# Patient Record
Sex: Female | Born: 1975 | Race: Black or African American | Hispanic: No | Marital: Single | Smoking: Never smoker
Health system: Southern US, Community
[De-identification: ages and names within clinical notes are randomized; demographics above are authoritative.]

## PROBLEM LIST (undated history)

## (undated) DIAGNOSIS — R011 Cardiac murmur, unspecified: Secondary | ICD-10-CM

## (undated) DIAGNOSIS — I10 Essential (primary) hypertension: Secondary | ICD-10-CM

## (undated) DIAGNOSIS — E079 Disorder of thyroid, unspecified: Secondary | ICD-10-CM

## (undated) DIAGNOSIS — E039 Hypothyroidism, unspecified: Secondary | ICD-10-CM

## (undated) DIAGNOSIS — F419 Anxiety disorder, unspecified: Secondary | ICD-10-CM

## (undated) DIAGNOSIS — K219 Gastro-esophageal reflux disease without esophagitis: Secondary | ICD-10-CM

## (undated) HISTORY — PX: TUBAL LIGATION: SHX77

---

## 1998-02-12 ENCOUNTER — Inpatient Hospital Stay (HOSPITAL_COMMUNITY): Admission: AD | Admit: 1998-02-12 | Discharge: 1998-02-14 | Payer: Self-pay | Admitting: Gynecology

## 1998-02-12 ENCOUNTER — Inpatient Hospital Stay (HOSPITAL_COMMUNITY): Admission: AD | Admit: 1998-02-12 | Discharge: 1998-02-12 | Payer: Self-pay | Admitting: Obstetrics and Gynecology

## 1998-11-03 ENCOUNTER — Inpatient Hospital Stay (HOSPITAL_COMMUNITY): Admission: AD | Admit: 1998-11-03 | Discharge: 1998-11-03 | Payer: Self-pay | Admitting: Ophthalmology

## 1999-05-15 DIAGNOSIS — A159 Respiratory tuberculosis unspecified: Secondary | ICD-10-CM

## 1999-05-15 HISTORY — DX: Respiratory tuberculosis unspecified: A15.9

## 2000-09-05 ENCOUNTER — Other Ambulatory Visit: Admission: RE | Admit: 2000-09-05 | Discharge: 2000-09-05 | Payer: Self-pay | Admitting: Gynecology

## 2001-09-11 ENCOUNTER — Other Ambulatory Visit: Admission: RE | Admit: 2001-09-11 | Discharge: 2001-09-11 | Payer: Self-pay | Admitting: Gynecology

## 2002-01-22 ENCOUNTER — Ambulatory Visit (HOSPITAL_COMMUNITY): Admission: RE | Admit: 2002-01-22 | Discharge: 2002-01-22 | Payer: Self-pay | Admitting: Gynecology

## 2003-01-22 ENCOUNTER — Other Ambulatory Visit: Admission: RE | Admit: 2003-01-22 | Discharge: 2003-01-22 | Payer: Self-pay | Admitting: Internal Medicine

## 2003-02-10 ENCOUNTER — Encounter: Admission: RE | Admit: 2003-02-10 | Discharge: 2003-02-10 | Payer: Self-pay | Admitting: Internal Medicine

## 2003-02-10 ENCOUNTER — Encounter: Payer: Self-pay | Admitting: Internal Medicine

## 2003-07-17 ENCOUNTER — Emergency Department (HOSPITAL_COMMUNITY): Admission: EM | Admit: 2003-07-17 | Discharge: 2003-07-17 | Payer: Self-pay | Admitting: *Deleted

## 2004-02-16 ENCOUNTER — Other Ambulatory Visit: Admission: RE | Admit: 2004-02-16 | Discharge: 2004-02-16 | Payer: Self-pay | Admitting: Obstetrics and Gynecology

## 2005-06-18 ENCOUNTER — Other Ambulatory Visit: Admission: RE | Admit: 2005-06-18 | Discharge: 2005-06-18 | Payer: Self-pay | Admitting: Internal Medicine

## 2006-07-01 ENCOUNTER — Other Ambulatory Visit: Admission: RE | Admit: 2006-07-01 | Discharge: 2006-07-01 | Payer: Self-pay | Admitting: Gastroenterology

## 2007-11-17 ENCOUNTER — Other Ambulatory Visit: Admission: RE | Admit: 2007-11-17 | Discharge: 2007-11-17 | Payer: Self-pay | Admitting: Internal Medicine

## 2009-01-06 ENCOUNTER — Other Ambulatory Visit: Admission: RE | Admit: 2009-01-06 | Discharge: 2009-01-06 | Payer: Self-pay | Admitting: Obstetrics and Gynecology

## 2010-02-21 ENCOUNTER — Other Ambulatory Visit: Admission: RE | Admit: 2010-02-21 | Discharge: 2010-02-21 | Payer: Self-pay | Admitting: *Deleted

## 2010-02-25 ENCOUNTER — Emergency Department (HOSPITAL_COMMUNITY): Admission: RE | Admit: 2010-02-25 | Discharge: 2010-02-26 | Payer: Self-pay | Admitting: Family Medicine

## 2010-06-15 ENCOUNTER — Ambulatory Visit (HOSPITAL_COMMUNITY): Payer: Commercial Managed Care - PPO

## 2010-07-19 ENCOUNTER — Ambulatory Visit: Payer: 59 | Attending: Orthopedic Surgery | Admitting: Rehabilitative and Restorative Service Providers"

## 2010-07-19 DIAGNOSIS — M25579 Pain in unspecified ankle and joints of unspecified foot: Secondary | ICD-10-CM | POA: Insufficient documentation

## 2010-07-19 DIAGNOSIS — IMO0001 Reserved for inherently not codable concepts without codable children: Secondary | ICD-10-CM | POA: Insufficient documentation

## 2010-07-19 DIAGNOSIS — M25676 Stiffness of unspecified foot, not elsewhere classified: Secondary | ICD-10-CM | POA: Insufficient documentation

## 2010-07-19 DIAGNOSIS — M25673 Stiffness of unspecified ankle, not elsewhere classified: Secondary | ICD-10-CM | POA: Insufficient documentation

## 2010-07-24 ENCOUNTER — Ambulatory Visit: Payer: 59 | Admitting: Physical Therapy

## 2010-07-26 LAB — DIFFERENTIAL
Basophils Absolute: 0 10*3/uL (ref 0.0–0.1)
Basophils Relative: 1 % (ref 0–1)
Eosinophils Absolute: 0 10*3/uL (ref 0.0–0.7)
Eosinophils Relative: 1 % (ref 0–5)
Lymphocytes Relative: 24 % (ref 12–46)

## 2010-07-26 LAB — POCT I-STAT, CHEM 8
Calcium, Ion: 1.04 mmol/L — ABNORMAL LOW (ref 1.12–1.32)
HCT: 43 % (ref 36.0–46.0)
Hemoglobin: 14.6 g/dL (ref 12.0–15.0)
TCO2: 24 mmol/L (ref 0–100)

## 2010-07-26 LAB — URINALYSIS, ROUTINE W REFLEX MICROSCOPIC
Hgb urine dipstick: NEGATIVE
Protein, ur: NEGATIVE mg/dL
Urobilinogen, UA: 0.2 mg/dL (ref 0.0–1.0)

## 2010-07-26 LAB — CBC
MCH: 28.1 pg (ref 26.0–34.0)
MCV: 84 fL (ref 78.0–100.0)
Platelets: 295 10*3/uL (ref 150–400)
RDW: 13 % (ref 11.5–15.5)
WBC: 5.7 10*3/uL (ref 4.0–10.5)

## 2010-07-26 LAB — POCT PREGNANCY, URINE: Preg Test, Ur: NEGATIVE

## 2010-07-26 LAB — WET PREP, GENITAL: Yeast Wet Prep HPF POC: NONE SEEN

## 2010-07-27 ENCOUNTER — Ambulatory Visit: Payer: 59

## 2010-07-31 ENCOUNTER — Encounter: Payer: Commercial Managed Care - PPO | Admitting: Rehabilitative and Restorative Service Providers"

## 2010-07-31 ENCOUNTER — Encounter: Payer: Commercial Managed Care - PPO | Admitting: Physical Therapy

## 2010-08-03 ENCOUNTER — Ambulatory Visit: Payer: 59 | Admitting: Physical Therapy

## 2010-08-03 ENCOUNTER — Other Ambulatory Visit: Payer: Self-pay | Admitting: *Deleted

## 2010-08-03 ENCOUNTER — Other Ambulatory Visit (HOSPITAL_COMMUNITY)
Admission: RE | Admit: 2010-08-03 | Discharge: 2010-08-03 | Disposition: A | Discharge status: Home / Self Care | Payer: Commercial Managed Care - PPO | Source: Ambulatory Visit | Attending: Internal Medicine | Admitting: Internal Medicine

## 2010-08-03 DIAGNOSIS — Z01419 Encounter for gynecological examination (general) (routine) without abnormal findings: Secondary | ICD-10-CM | POA: Insufficient documentation

## 2010-08-09 ENCOUNTER — Encounter: Payer: Commercial Managed Care - PPO | Admitting: Rehabilitative and Restorative Service Providers"

## 2010-09-29 NOTE — H&P (Signed)
NAME:  Samantha Robinson, Samantha Robinson NO.:  [id number]   MEDICAL RECORD NO.:  [id number]                   PATIENT TYPE:   LOCATION:                                       FACILITY:   PHYSICIAN:  Timothy P. Fontaine, M.D.           DATE OF BIRTH:   DATE OF ADMISSION:  01/22/2002  DATE OF DISCHARGE:                                HISTORY & PHYSICAL   CHIEF COMPLAINT:  Sterilization.   HISTORY OF PRESENT ILLNESS:  A 35 year old G-1, P-1 female for tubal  sterilization.  Risks, benefits, indications and alternatives to include  nonpermanent options were reviewed with her and she elected for tubal  sterilization.   PAST MEDICAL HISTORY:  Hypothyroidism.   PAST SURGICAL HISTORY:  None.   MEDICATIONS:  Synthroid.   ALLERGIES:  CILLINS.   REVIEW OF SYSTEMS:  Noncontributory.   SOCIAL HISTORY:  Noncontributory.   FAMILY HISTORY:  Noncontributory.   PHYSICAL EXAMINATION:  Afebrile.  VITAL SIGNS:  Stable.  HEENT:  Normal.  LUNGS:  Clear.  CARDIAC:  Regular rate without murmur, rubs or gallops.  ABDOMINAL EXAM:  Benign.  PELVIC EXAM BIMANUAL:  Uterus normal size.  Retroverted.  Adnexa without  masses or tenderness.   ASSESSMENT:  A 35 year old G-1, P-1 female who desires permanent  sterilization.  The patient currently is on Ortho Evra patch. She is in a  stable relationship with her boyfriend who had one child and she has a  child.  I discussed with her the variable options to include staying on  Ortho Evra, other forms of hormonal contraception as well as IUD, vasectomy  and tubal sterilization.  The patient recently had Chlamydia about a year  ago and two years before that.  I discussed with her that at 84 with one  child I reviewed the issues of her regret with sterilization and my concern  as well as several years from now she may wish she did not have this done  and she may desire other children.  The patient states emphatically that she  has really  thought through this and she is very happy with one child and is  very comfortable with this situation and decision and wants tubal  sterilization.  I reviewed what is involved with laparoscopic sterilization  to include general anesthesia insufflation, trocar placement, use of Falope  rings and bipolar cautery.  The risks of infection, both incisional  requiring opening and draining of incisions as well as internal requiring  prolonged antibiotics, were all discussed, understood and understood. The  risk of hemorrhage necessitating transfusion and the risk of transfusion  were discussed include transfusion reaction, hepatitis, HIV, mad cow disease  and other unknown entities.  The risks of inadvertant injury to internal  organs both immediately recognized and delay recognized including bowel,  bladder, ureters, vessels and nerves necessitating major exploratory  reparative surgeries and future reparative surgeries including ostomy  formation were all discussed,  understood and accepted.  I stressed the  permanency of the procedure with her as well as the potential for failure  and she understands and accepts these risks.  The patient's questions were  answered to her satisfaction and she was ready to proceed with surgery.                                               Timothy P. Audie Box, M.D.    TPF/MEDQ  D:  01/15/2002  T:  01/15/2002  Job:  [id number]

## 2010-09-29 NOTE — Op Note (Signed)
NAME:  Samantha Robinson, Samantha Robinson                      ACCOUNT NO.:  [id number]   MEDICAL RECORD NO.:  [id number]                   PATIENT TYPE:  AMB   LOCATION:  [facility]                                  FACILITY:  [facility]   PHYSICIAN:  Timothy P. Fontaine, M.D.           DATE OF BIRTH:  12-Jul-1975   DATE OF PROCEDURE:  01/22/2002  DATE OF DISCHARGE:                                 OPERATIVE REPORT   PREOPERATIVE DIAGNOSES:  Desires permanent sterilization.   POSTOPERATIVE DIAGNOSES:  Desires permanent sterilization.   PROCEDURE:  Laparoscopic bilateral tubal sterilization, Falope ring  technique.   SURGEON:  Timothy P. Fontaine, M.D.   ANESTHESIA:  General.   COMPLICATIONS:  None.   ESTIMATED BLOOD LOSS:  Minimal.   SPECIMENS:  None.   FINDINGS:  Pelvic anatomy grossly normal noting anterior cul-de-sac normal,  posterior cul-de-sac normal, uterus normal size, shape, contour.  Right and  left fallopian tubes normal length, caliber, fimbriated end, single Falope  ring applied to each tube.  Right and left ovaries grossly normal, free, and  mobile.  No evidence of pelvic endometriosis or pelvic adhesions.  Appendix  was grossly normal, free, and mobile.  Liver is smooth, no abnormalities.  Gallbladder not visualized.   PROCEDURE:  The patient was taken to the operating room.  Underwent general  anesthesia.  Was placed in a low dorsal lithotomy position.  Received an  abdominal, perineal, and vaginal preparation with Betadine solution.  Bladder emptied with in-and-out Foley catheterization.  EUA performed and a  Hulka tenaculum was placed on the cervix.  The patient was draped in the  usual fashion.  A vertical infraumbilical incision was made.  The Veress  needle was placed, its position verified with water, and 3 L of carbon  dioxide gas was infused without difficulty.  The 10 mm laparoscopic trocar  was placed without difficulty, its position verified visually.  Examination  of the  pelvic organs and upper abdominal examination was then carried out  with findings as noted above.  A suprapubic Falope ring applying trocar was  then placed in the midline after transillumination for the vessels under  direct visualization without difficulty.  The right fallopian tube was then  identified, traced from its insertion to its fimbriated end, and a mid tubal  segment was then drawn up into the Falope ring applier, a single Falope ring  applied.  A good knuckle of tube within the ring was noted and appropriate  blanching after application was also noted.  Manipulation after application  assured secure placement.  A similar procedure was carried out on the other  side.  The suprapubic port was then removed, the gas allowed to escape.  Adequate hemostasis visualized under a low pressure situation and then the  infraumbilical port was backed out under direct visualization showing  adequate hemostasis and no evidence of hernia formation.  A 0 Vicryl  infraumbilical subcutaneous fascial stitch was placed.  The suprapubic port  reapproximated using Dermabond skin adhesive and the infraumbilical port  reapproximated using 3-0 Monocryl in interrupted subcuticular stitch.  Both  skin incisions were infiltrated using 0.25% Marcaine.  Sterile dressing applied infraumbilically.  The patient placed in supine  position after Hulka tenaculum removal.  The patient was awakened without  difficulty and taken to recovery room in good condition having tolerated the  procedure well.                                               Timothy P. Audie Box, M.D.    TPF/MEDQ  D:  01/22/2002  T:  01/22/2002  Job:  [id number]

## 2011-09-10 ENCOUNTER — Other Ambulatory Visit (HOSPITAL_COMMUNITY)
Admission: RE | Admit: 2011-09-10 | Discharge: 2011-09-10 | Disposition: A | Discharge status: Home / Self Care | Payer: 59 | Source: Ambulatory Visit | Attending: Internal Medicine | Admitting: Internal Medicine

## 2011-09-10 ENCOUNTER — Other Ambulatory Visit: Payer: Self-pay | Admitting: *Deleted

## 2011-09-10 DIAGNOSIS — Z01419 Encounter for gynecological examination (general) (routine) without abnormal findings: Secondary | ICD-10-CM | POA: Insufficient documentation

## 2012-10-09 ENCOUNTER — Other Ambulatory Visit (HOSPITAL_COMMUNITY)
Admission: RE | Admit: 2012-10-09 | Discharge: 2012-10-09 | Disposition: A | Discharge status: Home / Self Care | Payer: 59 | Source: Ambulatory Visit | Attending: Internal Medicine | Admitting: Internal Medicine

## 2012-10-09 ENCOUNTER — Other Ambulatory Visit: Payer: Self-pay | Admitting: Nurse Practitioner

## 2012-10-09 DIAGNOSIS — Z01419 Encounter for gynecological examination (general) (routine) without abnormal findings: Secondary | ICD-10-CM | POA: Insufficient documentation

## 2013-11-05 ENCOUNTER — Other Ambulatory Visit: Payer: Self-pay | Admitting: Obstetrics and Gynecology

## 2013-11-09 LAB — CYTOLOGY - PAP

## 2014-12-15 ENCOUNTER — Other Ambulatory Visit: Payer: Self-pay

## 2015-05-27 MED FILL — LEVOTHYROXINE 125 MCG TAB: 125 | 84 days supply | Qty: 96 | Fill #2

## 2015-06-14 MED FILL — MELOXICAM 7.5 MG TABLET: 7.5 | 10 days supply | Qty: 20 | Fill #0

## 2015-06-24 MED FILL — OMEPRAZOLE DR 20 MG CAPSULE: 20 | 90 days supply | Qty: 90 | Fill #2

## 2015-06-24 MED FILL — CITALOPRAM HBR 20 MG TABLET: 20 | 90 days supply | Qty: 90 | Fill #3

## 2015-06-27 MED FILL — DASETTA 1-35-28 TABLET: 1-35 | 84 days supply | Qty: 84 | Fill #2

## 2015-07-15 MED FILL — MELOXICAM 7.5 MG TABLET: 7.5 | 10 days supply | Qty: 20 | Fill #0

## 2015-08-19 MED FILL — MELOXICAM 7.5 MG TABLET: 7.5 | 10 days supply | Qty: 20 | Fill #1

## 2015-09-19 MED FILL — MELOXICAM 7.5 MG TABLET: 7.5 | 10 days supply | Qty: 20 | Fill #0

## 2015-09-21 DIAGNOSIS — H47323 Drusen of optic disc, bilateral: Secondary | ICD-10-CM | POA: Diagnosis not present

## 2015-09-23 MED FILL — LEVOTHYROXINE 125 MCG TAB: 125 | 84 days supply | Qty: 96 | Fill #0

## 2015-09-23 MED FILL — DASETTA 1-35-28 TABLET: 1-35 | 84 days supply | Qty: 84 | Fill #3

## 2015-10-21 MED FILL — MELOXICAM 7.5 MG TABLET: 7.5 | 10 days supply | Qty: 20 | Fill #1

## 2015-10-21 MED FILL — OMEPRAZOLE DR 20 MG CAPSULE: 20 | 30 days supply | Qty: 30 | Fill #0

## 2015-10-21 MED FILL — CITALOPRAM HBR 20 MG TABLET: 20 | 30 days supply | Qty: 30 | Fill #0

## 2015-12-01 ENCOUNTER — Ambulatory Visit
Admission: RE | Admit: 2015-12-01 | Discharge: 2015-12-01 | Disposition: A | Discharge status: Home / Self Care | Payer: 59 | Source: Ambulatory Visit | Attending: Internal Medicine | Admitting: Internal Medicine

## 2015-12-01 ENCOUNTER — Other Ambulatory Visit: Payer: Self-pay | Admitting: Internal Medicine

## 2015-12-01 DIAGNOSIS — M545 Low back pain, unspecified: Secondary | ICD-10-CM

## 2015-12-01 DIAGNOSIS — E669 Obesity, unspecified: Secondary | ICD-10-CM | POA: Diagnosis not present

## 2015-12-01 DIAGNOSIS — K219 Gastro-esophageal reflux disease without esophagitis: Secondary | ICD-10-CM | POA: Diagnosis not present

## 2015-12-01 DIAGNOSIS — E039 Hypothyroidism, unspecified: Secondary | ICD-10-CM | POA: Diagnosis not present

## 2015-12-01 DIAGNOSIS — F419 Anxiety disorder, unspecified: Secondary | ICD-10-CM | POA: Diagnosis not present

## 2015-12-01 DIAGNOSIS — Z6839 Body mass index (BMI) 39.0-39.9, adult: Secondary | ICD-10-CM | POA: Diagnosis not present

## 2015-12-01 MED FILL — LEVOTHYROXINE 125 MCG TAB: 125 | 90 days supply | Qty: 96 | Fill #0

## 2015-12-01 MED FILL — tiZANidine HCL 2 MG TABS: 2 | 10 days supply | Qty: 20 | Fill #0

## 2015-12-01 MED FILL — OMEPRAZOLE DR 20 MG CAPSULE: 20 | 30 days supply | Qty: 30 | Fill #0

## 2015-12-01 MED FILL — MELOXICAM 15 MG TABLET: 15 | 20 days supply | Qty: 20 | Fill #0

## 2015-12-01 MED FILL — CITALOPRAM HBR 20 MG TABLET: 20 | 30 days supply | Qty: 30 | Fill #0

## 2015-12-15 DIAGNOSIS — M545 Low back pain: Secondary | ICD-10-CM | POA: Diagnosis not present

## 2015-12-21 ENCOUNTER — Other Ambulatory Visit: Payer: Self-pay | Admitting: Obstetrics

## 2015-12-21 DIAGNOSIS — Z6841 Body Mass Index (BMI) 40.0 and over, adult: Secondary | ICD-10-CM | POA: Diagnosis not present

## 2015-12-21 DIAGNOSIS — Z01419 Encounter for gynecological examination (general) (routine) without abnormal findings: Secondary | ICD-10-CM | POA: Diagnosis not present

## 2015-12-21 DIAGNOSIS — Z124 Encounter for screening for malignant neoplasm of cervix: Secondary | ICD-10-CM | POA: Diagnosis not present

## 2015-12-21 DIAGNOSIS — Z113 Encounter for screening for infections with a predominantly sexual mode of transmission: Secondary | ICD-10-CM | POA: Diagnosis not present

## 2015-12-21 DIAGNOSIS — Z1231 Encounter for screening mammogram for malignant neoplasm of breast: Secondary | ICD-10-CM | POA: Diagnosis not present

## 2015-12-21 MED FILL — DASETTA 1-35-28 TABLET: 1-35 | 63 days supply | Qty: 84 | Fill #0

## 2015-12-22 LAB — CYTOLOGY - PAP

## 2015-12-23 DIAGNOSIS — M545 Low back pain: Secondary | ICD-10-CM | POA: Diagnosis not present

## 2015-12-23 DIAGNOSIS — M5136 Other intervertebral disc degeneration, lumbar region: Secondary | ICD-10-CM | POA: Diagnosis not present

## 2015-12-23 DIAGNOSIS — M5137 Other intervertebral disc degeneration, lumbosacral region: Secondary | ICD-10-CM | POA: Diagnosis not present

## 2015-12-23 DIAGNOSIS — R293 Abnormal posture: Secondary | ICD-10-CM | POA: Diagnosis not present

## 2015-12-29 DIAGNOSIS — M5136 Other intervertebral disc degeneration, lumbar region: Secondary | ICD-10-CM | POA: Diagnosis not present

## 2015-12-29 DIAGNOSIS — M5137 Other intervertebral disc degeneration, lumbosacral region: Secondary | ICD-10-CM | POA: Diagnosis not present

## 2015-12-29 DIAGNOSIS — M545 Low back pain: Secondary | ICD-10-CM | POA: Diagnosis not present

## 2015-12-29 DIAGNOSIS — R293 Abnormal posture: Secondary | ICD-10-CM | POA: Diagnosis not present

## 2016-01-06 DIAGNOSIS — E039 Hypothyroidism, unspecified: Secondary | ICD-10-CM | POA: Diagnosis not present

## 2016-01-06 DIAGNOSIS — Z1231 Encounter for screening mammogram for malignant neoplasm of breast: Secondary | ICD-10-CM | POA: Diagnosis not present

## 2016-01-06 DIAGNOSIS — K219 Gastro-esophageal reflux disease without esophagitis: Secondary | ICD-10-CM | POA: Diagnosis not present

## 2016-01-06 DIAGNOSIS — E669 Obesity, unspecified: Secondary | ICD-10-CM | POA: Diagnosis not present

## 2016-01-06 DIAGNOSIS — Z0000 Encounter for general adult medical examination without abnormal findings: Secondary | ICD-10-CM | POA: Diagnosis not present

## 2016-01-06 DIAGNOSIS — Z23 Encounter for immunization: Secondary | ICD-10-CM | POA: Diagnosis not present

## 2016-01-06 DIAGNOSIS — F419 Anxiety disorder, unspecified: Secondary | ICD-10-CM | POA: Diagnosis not present

## 2016-01-06 DIAGNOSIS — M545 Low back pain: Secondary | ICD-10-CM | POA: Diagnosis not present

## 2016-01-06 DIAGNOSIS — Z6839 Body mass index (BMI) 39.0-39.9, adult: Secondary | ICD-10-CM | POA: Diagnosis not present

## 2016-01-17 MED FILL — OMEPRAZOLE DR 20 MG CAPSULE: 20 | 30 days supply | Qty: 30 | Fill #1

## 2016-01-17 MED FILL — CITALOPRAM HBR 20 MG TABLET: 20 | 30 days supply | Qty: 30 | Fill #1

## 2016-01-17 MED FILL — MELOXICAM 15 MG TABLET: 15 | 20 days supply | Qty: 20 | Fill #1

## 2016-02-24 MED FILL — DASETTA 1-35-28 TABLET: 1-35 | 63 days supply | Qty: 84 | Fill #1

## 2016-02-24 MED FILL — CITALOPRAM HBR 20 MG TABLET: 20 | 30 days supply | Qty: 30 | Fill #1

## 2016-02-24 MED FILL — OMEPRAZOLE DR 20 MG CAPSULE: 20 | 30 days supply | Qty: 30 | Fill #1

## 2016-03-01 DIAGNOSIS — Z23 Encounter for immunization: Secondary | ICD-10-CM | POA: Diagnosis not present

## 2016-03-29 MED FILL — CITALOPRAM HBR 20 MG TABLET: 20 | 30 days supply | Qty: 30 | Fill #0

## 2016-03-29 MED FILL — OMEPRAZOLE DR 20 MG CAPSULE: 20 | 30 days supply | Qty: 30 | Fill #0

## 2016-05-10 MED FILL — CITALOPRAM HBR 20 MG TABLET: 20 | 30 days supply | Qty: 30 | Fill #1

## 2016-05-10 MED FILL — OMEPRAZOLE DR 20 MG CAPSULE: 20 | 30 days supply | Qty: 30 | Fill #1

## 2016-05-10 MED FILL — LEVOTHYROXINE 125 MCG TABLE: 125 | 90 days supply | Qty: 96 | Fill #1

## 2016-05-29 MED FILL — DASETTA 1-35-28 TABLET: 1-35 | 63 days supply | Qty: 84 | Fill #2

## 2016-06-21 MED FILL — CITALOPRAM HBR 20 MG TABLET: 20 | 30 days supply | Qty: 30 | Fill #0

## 2016-06-21 MED FILL — OMEPRAZOLE DR 20 MG CAPSULE: 20 | 30 days supply | Qty: 30 | Fill #0

## 2016-07-23 MED FILL — DASETTA 1-35-28 TABLET: 1-35 | 63 days supply | Qty: 84 | Fill #3

## 2016-07-23 MED FILL — OMEPRAZOLE DR 20 MG CAPSULE: 20 | 30 days supply | Qty: 30 | Fill #1

## 2016-07-23 MED FILL — CITALOPRAM HBR 20 MG TABLET: 20 | 30 days supply | Qty: 30 | Fill #1

## 2016-08-29 MED FILL — OMEPRAZOLE DR 20 MG CAPSULE: 20 | 30 days supply | Qty: 30 | Fill #2

## 2016-08-29 MED FILL — CITALOPRAM HBR 20 MG TABLET: 20 | 30 days supply | Qty: 30 | Fill #2

## 2016-08-29 MED FILL — LEVOTHYROXINE 125 MCG TABLE: 125 | 90 days supply | Qty: 96 | Fill #2

## 2016-10-16 MED FILL — CITALOPRAM HBR 20 MG TABLET: 20 | 30 days supply | Qty: 30 | Fill #3

## 2016-10-16 MED FILL — DASETTA 1-35-28 TABLET: 1-35 | 63 days supply | Qty: 84 | Fill #4

## 2016-10-16 MED FILL — OMEPRAZOLE DR 20 MG CAPSULE: 20 | 30 days supply | Qty: 30 | Fill #3

## 2016-10-24 ENCOUNTER — Ambulatory Visit
Admission: RE | Admit: 2016-10-24 | Discharge: 2016-10-24 | Disposition: A | Discharge status: Home / Self Care | Payer: 59 | Source: Ambulatory Visit | Attending: Internal Medicine | Admitting: Internal Medicine

## 2016-10-24 ENCOUNTER — Other Ambulatory Visit: Payer: Self-pay | Admitting: Internal Medicine

## 2016-10-24 DIAGNOSIS — M79671 Pain in right foot: Secondary | ICD-10-CM

## 2016-10-24 DIAGNOSIS — M79672 Pain in left foot: Secondary | ICD-10-CM | POA: Diagnosis not present

## 2016-10-24 DIAGNOSIS — M7661 Achilles tendinitis, right leg: Secondary | ICD-10-CM | POA: Diagnosis not present

## 2016-10-24 DIAGNOSIS — M7662 Achilles tendinitis, left leg: Secondary | ICD-10-CM | POA: Diagnosis not present

## 2016-10-31 DIAGNOSIS — M722 Plantar fascial fibromatosis: Secondary | ICD-10-CM | POA: Diagnosis not present

## 2016-10-31 DIAGNOSIS — M7661 Achilles tendinitis, right leg: Secondary | ICD-10-CM | POA: Diagnosis not present

## 2016-10-31 DIAGNOSIS — M76821 Posterior tibial tendinitis, right leg: Secondary | ICD-10-CM | POA: Diagnosis not present

## 2016-11-28 MED FILL — OMEPRAZOLE DR 20 MG CAPSULE: 20 | 30 days supply | Qty: 30 | Fill #4

## 2016-11-28 MED FILL — CITALOPRAM HBR 20 MG TABLET: 20 | 30 days supply | Qty: 30 | Fill #4

## 2016-11-29 MED FILL — MELOXICAM 7.5 MG TABLET: 7.5 | 10 days supply | Qty: 20 | Fill #0

## 2016-12-03 DIAGNOSIS — M2142 Flat foot [pes planus] (acquired), left foot: Secondary | ICD-10-CM | POA: Diagnosis not present

## 2016-12-03 DIAGNOSIS — M2141 Flat foot [pes planus] (acquired), right foot: Secondary | ICD-10-CM | POA: Diagnosis not present

## 2016-12-19 MED FILL — DASETTA 1-35-28 TABLET: 1-35 | 63 days supply | Qty: 84 | Fill #5

## 2017-01-09 DIAGNOSIS — E039 Hypothyroidism, unspecified: Secondary | ICD-10-CM | POA: Diagnosis not present

## 2017-01-09 DIAGNOSIS — E669 Obesity, unspecified: Secondary | ICD-10-CM | POA: Diagnosis not present

## 2017-01-09 DIAGNOSIS — M79671 Pain in right foot: Secondary | ICD-10-CM | POA: Diagnosis not present

## 2017-01-09 DIAGNOSIS — F419 Anxiety disorder, unspecified: Secondary | ICD-10-CM | POA: Diagnosis not present

## 2017-01-09 DIAGNOSIS — Z1231 Encounter for screening mammogram for malignant neoplasm of breast: Secondary | ICD-10-CM | POA: Diagnosis not present

## 2017-01-09 DIAGNOSIS — K219 Gastro-esophageal reflux disease without esophagitis: Secondary | ICD-10-CM | POA: Diagnosis not present

## 2017-01-09 DIAGNOSIS — Z136 Encounter for screening for cardiovascular disorders: Secondary | ICD-10-CM | POA: Diagnosis not present

## 2017-01-09 DIAGNOSIS — Z0000 Encounter for general adult medical examination without abnormal findings: Secondary | ICD-10-CM | POA: Diagnosis not present

## 2017-01-09 DIAGNOSIS — Z6837 Body mass index (BMI) 37.0-37.9, adult: Secondary | ICD-10-CM | POA: Diagnosis not present

## 2017-01-09 MED FILL — LEVOTHYROXINE 125 MCG TABLE: 125 | 90 days supply | Qty: 96 | Fill #0

## 2017-01-09 MED FILL — CITALOPRAM HBR 20 MG TABLET: 20 | 90 days supply | Qty: 90 | Fill #0

## 2017-01-09 MED FILL — MELOXICAM 7.5 MG TABLET: 7.5 | 10 days supply | Qty: 20 | Fill #0

## 2017-02-14 DIAGNOSIS — Z113 Encounter for screening for infections with a predominantly sexual mode of transmission: Secondary | ICD-10-CM | POA: Diagnosis not present

## 2017-02-14 DIAGNOSIS — Z01419 Encounter for gynecological examination (general) (routine) without abnormal findings: Secondary | ICD-10-CM | POA: Diagnosis not present

## 2017-02-14 DIAGNOSIS — Z1231 Encounter for screening mammogram for malignant neoplasm of breast: Secondary | ICD-10-CM | POA: Diagnosis not present

## 2017-02-14 MED FILL — MELOXICAM 7.5 MG TABLET: 7.5 | 10 days supply | Qty: 20 | Fill #1

## 2017-02-14 MED FILL — OMEPRAZOLE 20 MG CAP: 20 | 30 days supply | Qty: 30 | Fill #5

## 2017-02-27 DIAGNOSIS — Z23 Encounter for immunization: Secondary | ICD-10-CM | POA: Diagnosis not present

## 2017-03-13 DIAGNOSIS — Z3041 Encounter for surveillance of contraceptive pills: Secondary | ICD-10-CM | POA: Diagnosis not present

## 2017-03-13 MED FILL — NORETHINDRONE 0.35 MG TAB: 0.35 | 84 days supply | Qty: 84 | Fill #0

## 2017-04-01 MED FILL — OMEPRAZOLE 20 MG CAP: 20 | 30 days supply | Qty: 30 | Fill #6

## 2017-04-10 MED FILL — MELOXICAM 7.5 MG TABLET: 7.5 | 10 days supply | Qty: 20 | Fill #2

## 2017-05-20 MED FILL — LEVOTHYROXINE 125 MCG TAB: 125 | 90 days supply | Qty: 96 | Fill #1

## 2017-05-20 MED FILL — OMEPRAZOLE 20 MG CAP: 20 | 30 days supply | Qty: 30 | Fill #7

## 2017-05-20 MED FILL — CITALOPRAM HBR 20 MG TABLET: 20 | 90 days supply | Qty: 90 | Fill #1

## 2017-06-17 DIAGNOSIS — F411 Generalized anxiety disorder: Secondary | ICD-10-CM | POA: Diagnosis not present

## 2017-06-17 DIAGNOSIS — D649 Anemia, unspecified: Secondary | ICD-10-CM | POA: Diagnosis not present

## 2017-06-17 DIAGNOSIS — E039 Hypothyroidism, unspecified: Secondary | ICD-10-CM | POA: Diagnosis not present

## 2017-06-17 DIAGNOSIS — M79671 Pain in right foot: Secondary | ICD-10-CM | POA: Diagnosis not present

## 2017-06-17 DIAGNOSIS — I10 Essential (primary) hypertension: Secondary | ICD-10-CM | POA: Diagnosis not present

## 2017-06-17 DIAGNOSIS — K219 Gastro-esophageal reflux disease without esophagitis: Secondary | ICD-10-CM | POA: Diagnosis not present

## 2017-06-17 MED FILL — OMEPRAZOLE 20 MG CAP: 20 | 90 days supply | Qty: 90 | Fill #0

## 2017-06-17 MED FILL — HYDROCHLOROTHIAZIDE 25 MG T: 25 | 30 days supply | Qty: 30 | Fill #0

## 2017-06-17 MED FILL — MELOXICAM 7.5 MG TABLET: 7.5 | 30 days supply | Qty: 30 | Fill #0

## 2017-07-09 MED FILL — NORETHINDRONE 0.35 MG TAB: 0.35 | 84 days supply | Qty: 84 | Fill #1 | Status: TO

## 2017-07-17 DIAGNOSIS — E039 Hypothyroidism, unspecified: Secondary | ICD-10-CM | POA: Diagnosis not present

## 2017-07-17 DIAGNOSIS — Z0000 Encounter for general adult medical examination without abnormal findings: Secondary | ICD-10-CM | POA: Diagnosis not present

## 2017-07-17 DIAGNOSIS — F411 Generalized anxiety disorder: Secondary | ICD-10-CM | POA: Diagnosis not present

## 2017-07-17 DIAGNOSIS — I10 Essential (primary) hypertension: Secondary | ICD-10-CM | POA: Diagnosis not present

## 2017-07-17 DIAGNOSIS — D649 Anemia, unspecified: Secondary | ICD-10-CM | POA: Diagnosis not present

## 2017-07-17 MED FILL — CITALOPRAM HBR 40 MG TABLET: 40 | 90 days supply | Qty: 90 | Fill #0 | Status: TO

## 2017-07-17 MED FILL — busPIRone HCL 10 MG TABS: 10 | 15 days supply | Qty: 30 | Fill #0

## 2017-07-22 MED FILL — HYDROCHLOROTHIAZIDE 25 MG T: 25 | 30 days supply | Qty: 30 | Fill #1

## 2017-08-12 ENCOUNTER — Emergency Department (HOSPITAL_COMMUNITY)
Admission: EM | Admit: 2017-08-12 | Discharge: 2017-08-12 | Disposition: A | Discharge status: Home / Self Care | Payer: 59 | Attending: Emergency Medicine | Admitting: Emergency Medicine

## 2017-08-12 ENCOUNTER — Encounter (HOSPITAL_COMMUNITY): Payer: Self-pay

## 2017-08-12 ENCOUNTER — Other Ambulatory Visit: Payer: Self-pay

## 2017-08-12 DIAGNOSIS — R55 Syncope and collapse: Secondary | ICD-10-CM | POA: Insufficient documentation

## 2017-08-12 DIAGNOSIS — R42 Dizziness and giddiness: Secondary | ICD-10-CM | POA: Diagnosis not present

## 2017-08-12 DIAGNOSIS — R110 Nausea: Secondary | ICD-10-CM | POA: Diagnosis not present

## 2017-08-12 HISTORY — DX: Disorder of thyroid, unspecified: E07.9

## 2017-08-12 HISTORY — DX: Anxiety disorder, unspecified: F41.9

## 2017-08-12 HISTORY — DX: Essential (primary) hypertension: I10

## 2017-08-12 LAB — CBC WITH DIFFERENTIAL/PLATELET
BASOS ABS: 0 10*3/uL (ref 0.0–0.1)
Basophils Relative: 0 %
EOS PCT: 2 %
Eosinophils Absolute: 0.1 10*3/uL (ref 0.0–0.7)
HCT: 36.6 % (ref 36.0–46.0)
Hemoglobin: 11.3 g/dL — ABNORMAL LOW (ref 12.0–15.0)
Lymphocytes Relative: 37 %
Lymphs Abs: 1.9 10*3/uL (ref 0.7–4.0)
MCH: 24.2 pg — ABNORMAL LOW (ref 26.0–34.0)
MCHC: 30.9 g/dL (ref 30.0–36.0)
MCV: 78.5 fL (ref 78.0–100.0)
MONO ABS: 0.4 10*3/uL (ref 0.1–1.0)
MONOS PCT: 9 %
Neutro Abs: 2.6 10*3/uL (ref 1.7–7.7)
Neutrophils Relative %: 52 %
PLATELETS: 361 10*3/uL (ref 150–400)
RBC: 4.66 MIL/uL (ref 3.87–5.11)
RDW: 14.1 % (ref 11.5–15.5)
WBC: 5.1 10*3/uL (ref 4.0–10.5)

## 2017-08-12 LAB — BASIC METABOLIC PANEL
Anion gap: 10 (ref 5–15)
BUN: 9 mg/dL (ref 6–20)
CO2: 24 mmol/L (ref 22–32)
CREATININE: 0.56 mg/dL (ref 0.44–1.00)
Calcium: 8.9 mg/dL (ref 8.9–10.3)
Chloride: 102 mmol/L (ref 101–111)
GFR calc Af Amer: >60 mL/min (ref >60)
Glucose, Bld: 100 mg/dL — ABNORMAL HIGH (ref 65–99)
POTASSIUM: 3.4 mmol/L — AB (ref 3.5–5.1)
SODIUM: 136 mmol/L (ref 135–145)

## 2017-08-12 LAB — URINALYSIS, ROUTINE W REFLEX MICROSCOPIC
BILIRUBIN URINE: NEGATIVE
Glucose, UA: NEGATIVE mg/dL
Ketones, ur: NEGATIVE mg/dL
LEUKOCYTES UA: NEGATIVE
NITRITE: NEGATIVE
PROTEIN: NEGATIVE mg/dL
Specific Gravity, Urine: 1.017 (ref 1.005–1.030)
pH: 5 (ref 5.0–8.0)

## 2017-08-12 LAB — POC URINE PREG, ED: PREG TEST UR: NEGATIVE

## 2017-08-12 NOTE — ED Triage Notes (Signed)
Pt reports while standing having conversation with manager when she suddenly felt like the room was spinning around her and felt like she was falling to the left. PT states her vision was "wavy" in bilateral eyes. Pt reports feeling as though left side of face is heavier that left. Face symmetrical. Decreased sensation on left side of face. Speech normal, no other neuro deficits.

## 2017-08-12 NOTE — ED Provider Notes (Signed)
Patient placed in Quick Look pathway, seen and evaluated   Chief Complaint: near-syncope  HPI:  Pt presenting to ED after near-syncopal episode. Pt states she felt off-balance and felt as though she was going to pass out with lightheadedness. Felt as though the room was spinning while speaking with her manager. Pt sat down with improvement. Reports assoc b/l blurry vision that has resolved, though states her left eye feels heavy. Denies HA or CP. Reports some mild nausea now in triage. New medications include hydrochlorothiazide which was started 2 months ago. Takes escitalopram for anxiety. Reports PCP told her she is borderline anemic and is taking iron supplements.  ROS: + near-syncope, + lightheadedness, - CP, - SOB, - HA (one)  Physical Exam:   Gen: No distress  Neuro: Awake and Alert  Skin: Warm    Focused Exam: No focal neuro deficits, CN intact. PERRL, EOM nl, normal strength, sensation, coordination. Heart sounds nl, RRR, lungs clear   Initiation of care has begun. The patient has been counseled on the process, plan, and necessity for staying for the completion/evaluation, and the remainder of the medical screening examination    Havard Radigan, [place] N, PA-C 08/12/17 1648    Hayden Rasmussen, MD 08/13/17 1209

## 2017-08-12 NOTE — Discharge Instructions (Signed)
Take a multi vitamin with potassium and iron.  Your hemoglobin was 11.3 which is slightly low.  Your potassium is slightly low.

## 2017-08-14 MED FILL — MELOXICAM 7.5 MG TABLET: 7.5 | 30 days supply | Qty: 30 | Fill #1

## 2017-08-15 DIAGNOSIS — H52223 Regular astigmatism, bilateral: Secondary | ICD-10-CM | POA: Diagnosis not present

## 2017-08-15 DIAGNOSIS — H5213 Myopia, bilateral: Secondary | ICD-10-CM | POA: Diagnosis not present

## 2017-08-19 IMAGING — CR DG FOOT COMPLETE 3+V*L*
3 series · 3 of 3 positions shown · non-contrast
Comparison: RIGHT foot reported separately.

CLINICAL DATA: BILATERAL foot pain.

EXAM:
LEFT FOOT - COMPLETE 3+ VIEW

[t foot ap left]
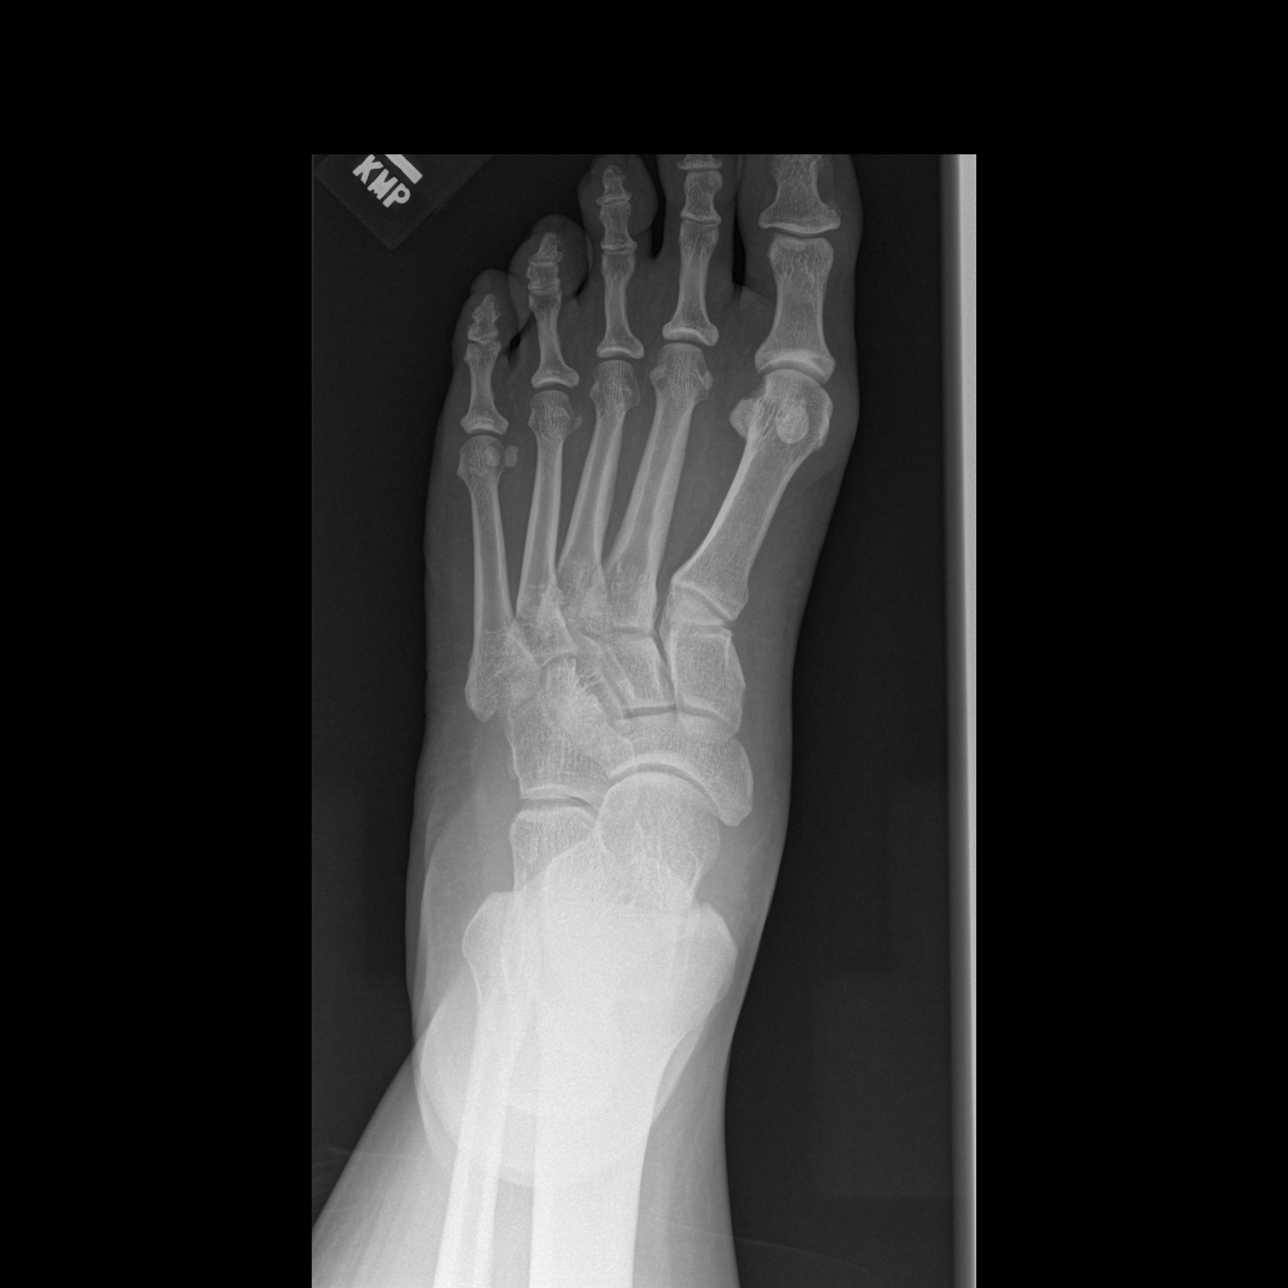

[t foot oblique left]
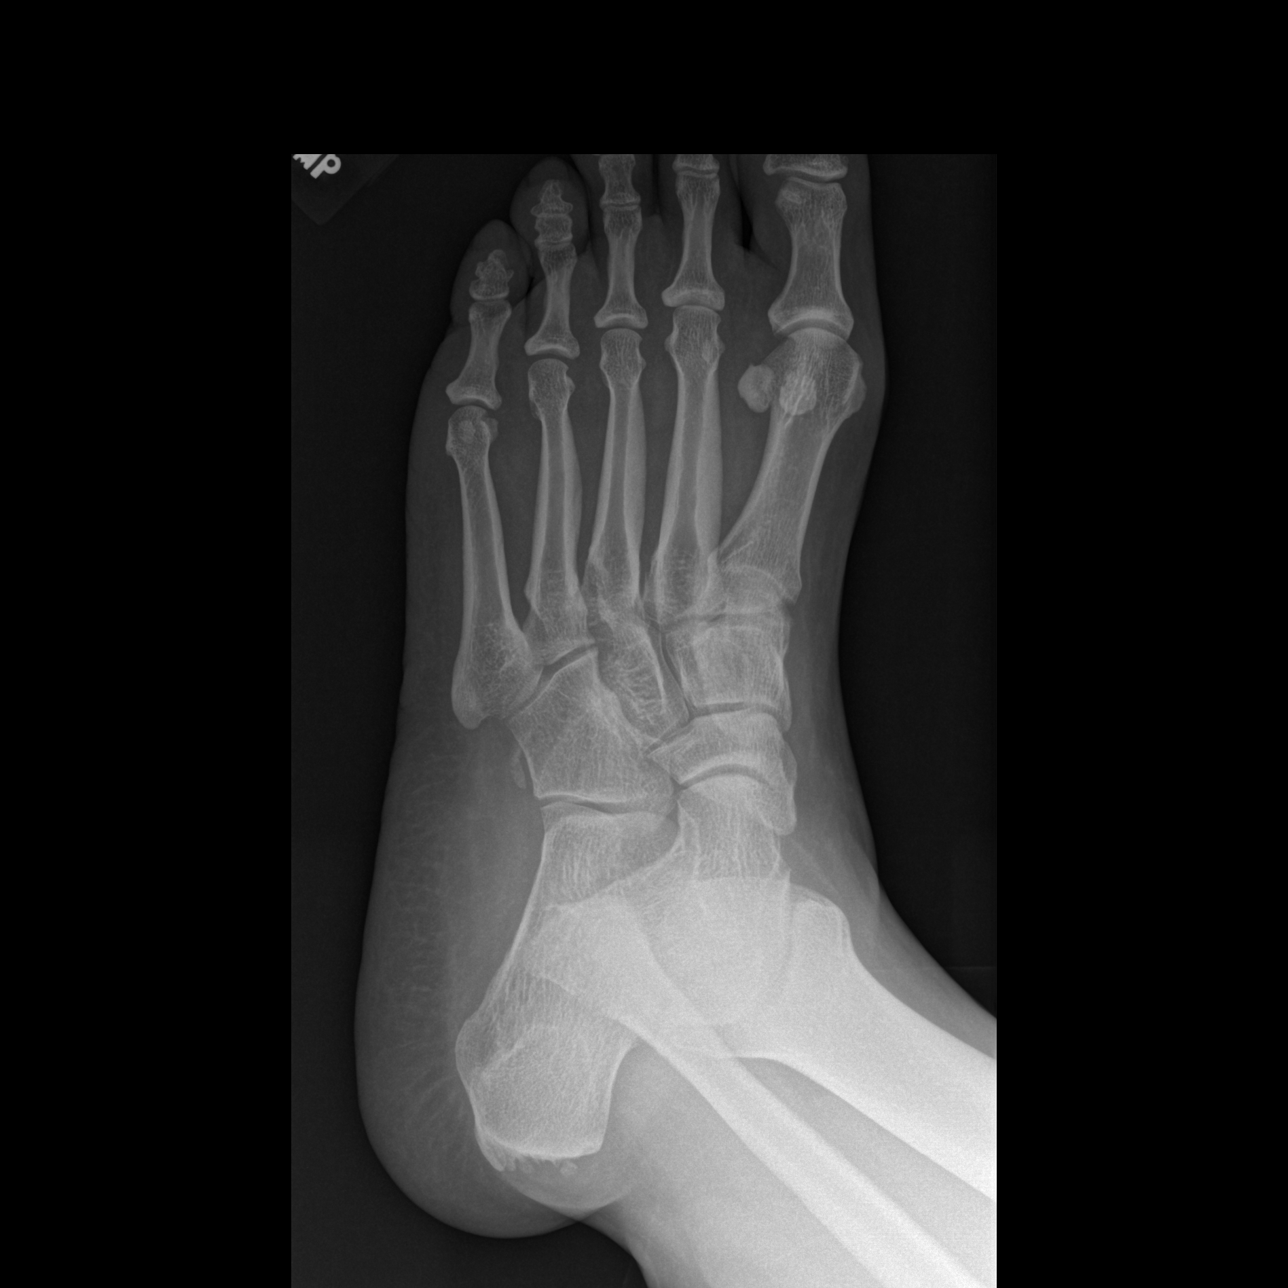

[t foot lat left]
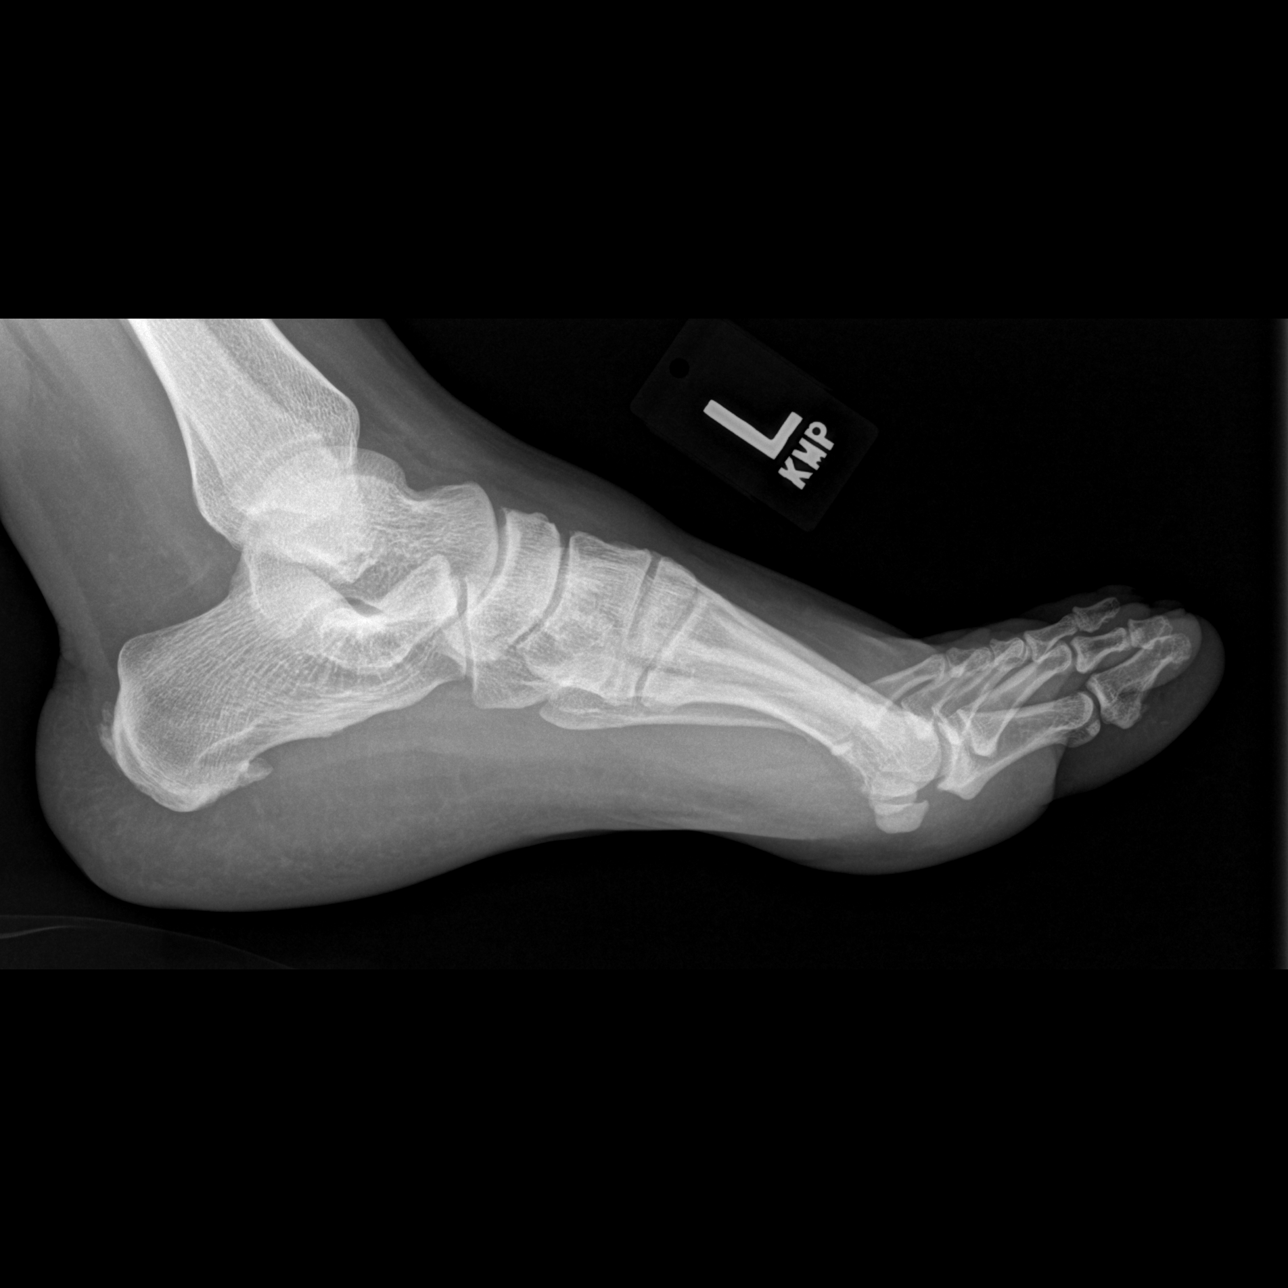

[3 of 3 positions shown; findings below may reference images not displayed]

FINDINGS: There is no evidence of fracture or dislocation. There is no erosive
arthropathy. Plantar and Achilles spurs are noted.
IMPRESSION: No acute findings.  Plantar and Achilles spurs are present.

## 2017-08-29 MED FILL — HYDROCHLOROTHIAZIDE 25 MG T: 25 | 90 days supply | Qty: 90 | Fill #2 | Status: TO

## 2017-08-29 MED FILL — LEVOTHYROXINE 125 MCG TAB: 125 | 90 days supply | Qty: 96 | Fill #2 | Status: TO

## 2017-08-30 MED FILL — OMEPRAZOLE 20 MG CAP: 20 | 90 days supply | Qty: 90 | Fill #1

## 2017-09-04 MED FILL — busPIRone HCL 10 MG TABS: 10 | 15 days supply | Qty: 30 | Fill #1

## 2018-03-17 DIAGNOSIS — N63 Unspecified lump in unspecified breast: Secondary | ICD-10-CM | POA: Diagnosis not present

## 2018-03-17 DIAGNOSIS — Z23 Encounter for immunization: Secondary | ICD-10-CM | POA: Diagnosis not present

## 2018-05-13 DIAGNOSIS — F419 Anxiety disorder, unspecified: Secondary | ICD-10-CM | POA: Diagnosis not present

## 2018-05-13 DIAGNOSIS — I10 Essential (primary) hypertension: Secondary | ICD-10-CM | POA: Diagnosis not present

## 2018-05-13 DIAGNOSIS — R7301 Impaired fasting glucose: Secondary | ICD-10-CM | POA: Diagnosis not present

## 2018-05-13 DIAGNOSIS — E039 Hypothyroidism, unspecified: Secondary | ICD-10-CM | POA: Diagnosis not present

## 2018-05-23 DIAGNOSIS — Z6841 Body Mass Index (BMI) 40.0 and over, adult: Secondary | ICD-10-CM | POA: Diagnosis not present

## 2018-05-23 DIAGNOSIS — Z113 Encounter for screening for infections with a predominantly sexual mode of transmission: Secondary | ICD-10-CM | POA: Diagnosis not present

## 2018-05-23 DIAGNOSIS — Z01419 Encounter for gynecological examination (general) (routine) without abnormal findings: Secondary | ICD-10-CM | POA: Diagnosis not present

## 2018-05-26 ENCOUNTER — Other Ambulatory Visit: Payer: Self-pay | Admitting: Obstetrics

## 2018-05-26 DIAGNOSIS — N6320 Unspecified lump in the left breast, unspecified quadrant: Secondary | ICD-10-CM

## 2018-05-30 ENCOUNTER — Ambulatory Visit
Admission: RE | Admit: 2018-05-30 | Discharge: 2018-05-30 | Disposition: A | Discharge status: Home / Self Care | Payer: 59 | Source: Ambulatory Visit | Attending: Obstetrics | Admitting: Obstetrics

## 2018-05-30 DIAGNOSIS — N6320 Unspecified lump in the left breast, unspecified quadrant: Secondary | ICD-10-CM

## 2018-05-30 DIAGNOSIS — N6489 Other specified disorders of breast: Secondary | ICD-10-CM | POA: Diagnosis not present

## 2018-05-30 DIAGNOSIS — R928 Other abnormal and inconclusive findings on diagnostic imaging of breast: Secondary | ICD-10-CM | POA: Diagnosis not present

## 2018-06-19 DIAGNOSIS — Z3202 Encounter for pregnancy test, result negative: Secondary | ICD-10-CM | POA: Diagnosis not present

## 2018-06-19 DIAGNOSIS — Z30430 Encounter for insertion of intrauterine contraceptive device: Secondary | ICD-10-CM | POA: Diagnosis not present

## 2018-06-19 DIAGNOSIS — Z6841 Body Mass Index (BMI) 40.0 and over, adult: Secondary | ICD-10-CM | POA: Diagnosis not present

## 2018-06-19 DIAGNOSIS — N926 Irregular menstruation, unspecified: Secondary | ICD-10-CM | POA: Diagnosis not present

## 2018-07-09 DIAGNOSIS — J069 Acute upper respiratory infection, unspecified: Secondary | ICD-10-CM | POA: Diagnosis not present

## 2018-09-22 DIAGNOSIS — Z03818 Encounter for observation for suspected exposure to other biological agents ruled out: Secondary | ICD-10-CM | POA: Diagnosis not present

## 2018-10-08 DIAGNOSIS — Z03818 Encounter for observation for suspected exposure to other biological agents ruled out: Secondary | ICD-10-CM | POA: Diagnosis not present

## 2018-10-08 DIAGNOSIS — R05 Cough: Secondary | ICD-10-CM | POA: Diagnosis not present

## 2018-10-08 DIAGNOSIS — R6883 Chills (without fever): Secondary | ICD-10-CM | POA: Diagnosis not present

## 2018-10-08 DIAGNOSIS — R52 Pain, unspecified: Secondary | ICD-10-CM | POA: Diagnosis not present

## 2018-10-08 DIAGNOSIS — J029 Acute pharyngitis, unspecified: Secondary | ICD-10-CM | POA: Diagnosis not present

## 2018-10-10 DIAGNOSIS — R05 Cough: Secondary | ICD-10-CM | POA: Diagnosis not present

## 2018-10-10 DIAGNOSIS — Z03818 Encounter for observation for suspected exposure to other biological agents ruled out: Secondary | ICD-10-CM | POA: Diagnosis not present

## 2018-10-24 DIAGNOSIS — K219 Gastro-esophageal reflux disease without esophagitis: Secondary | ICD-10-CM | POA: Diagnosis not present

## 2018-10-24 DIAGNOSIS — R0600 Dyspnea, unspecified: Secondary | ICD-10-CM | POA: Diagnosis not present

## 2018-11-11 DIAGNOSIS — Z0000 Encounter for general adult medical examination without abnormal findings: Secondary | ICD-10-CM | POA: Diagnosis not present

## 2018-11-19 DIAGNOSIS — E039 Hypothyroidism, unspecified: Secondary | ICD-10-CM | POA: Diagnosis not present

## 2018-11-19 DIAGNOSIS — I10 Essential (primary) hypertension: Secondary | ICD-10-CM | POA: Diagnosis not present

## 2018-12-30 DIAGNOSIS — Z03818 Encounter for observation for suspected exposure to other biological agents ruled out: Secondary | ICD-10-CM | POA: Diagnosis not present

## 2019-01-13 DIAGNOSIS — Z03818 Encounter for observation for suspected exposure to other biological agents ruled out: Secondary | ICD-10-CM | POA: Diagnosis not present

## 2019-01-21 DIAGNOSIS — E039 Hypothyroidism, unspecified: Secondary | ICD-10-CM | POA: Diagnosis not present

## 2019-01-21 DIAGNOSIS — Q159 Congenital malformation of eye, unspecified: Secondary | ICD-10-CM | POA: Diagnosis not present

## 2019-04-27 DIAGNOSIS — Z03818 Encounter for observation for suspected exposure to other biological agents ruled out: Secondary | ICD-10-CM | POA: Diagnosis not present

## 2019-05-11 DIAGNOSIS — Z03818 Encounter for observation for suspected exposure to other biological agents ruled out: Secondary | ICD-10-CM | POA: Diagnosis not present

## 2019-06-01 DIAGNOSIS — Z03818 Encounter for observation for suspected exposure to other biological agents ruled out: Secondary | ICD-10-CM | POA: Diagnosis not present

## 2019-08-06 DIAGNOSIS — D259 Leiomyoma of uterus, unspecified: Secondary | ICD-10-CM | POA: Diagnosis not present

## 2019-08-06 DIAGNOSIS — Z6841 Body Mass Index (BMI) 40.0 and over, adult: Secondary | ICD-10-CM | POA: Diagnosis not present

## 2019-08-06 DIAGNOSIS — N920 Excessive and frequent menstruation with regular cycle: Secondary | ICD-10-CM | POA: Diagnosis not present

## 2019-08-12 ENCOUNTER — Ambulatory Visit
Admission: RE | Admit: 2019-08-12 | Discharge: 2019-08-12 | Disposition: A | Discharge status: Home / Self Care | Payer: BC Managed Care – PPO | Source: Ambulatory Visit | Attending: Obstetrics | Admitting: Obstetrics

## 2019-08-12 ENCOUNTER — Other Ambulatory Visit: Payer: Self-pay | Admitting: Obstetrics

## 2019-08-12 DIAGNOSIS — Z30431 Encounter for routine checking of intrauterine contraceptive device: Secondary | ICD-10-CM

## 2019-08-17 DIAGNOSIS — Z03818 Encounter for observation for suspected exposure to other biological agents ruled out: Secondary | ICD-10-CM | POA: Diagnosis not present

## 2019-08-19 DIAGNOSIS — Z1159 Encounter for screening for other viral diseases: Secondary | ICD-10-CM | POA: Diagnosis not present

## 2019-08-19 DIAGNOSIS — Z20828 Contact with and (suspected) exposure to other viral communicable diseases: Secondary | ICD-10-CM | POA: Diagnosis not present

## 2019-09-01 DIAGNOSIS — Z0000 Encounter for general adult medical examination without abnormal findings: Secondary | ICD-10-CM | POA: Diagnosis not present

## 2019-09-01 DIAGNOSIS — I10 Essential (primary) hypertension: Secondary | ICD-10-CM | POA: Diagnosis not present

## 2019-09-01 DIAGNOSIS — E039 Hypothyroidism, unspecified: Secondary | ICD-10-CM | POA: Diagnosis not present

## 2019-09-01 DIAGNOSIS — K219 Gastro-esophageal reflux disease without esophagitis: Secondary | ICD-10-CM | POA: Diagnosis not present

## 2019-09-01 DIAGNOSIS — Z13220 Encounter for screening for lipoid disorders: Secondary | ICD-10-CM | POA: Diagnosis not present

## 2019-09-01 DIAGNOSIS — N920 Excessive and frequent menstruation with regular cycle: Secondary | ICD-10-CM | POA: Diagnosis not present

## 2019-09-01 DIAGNOSIS — N859 Noninflammatory disorder of uterus, unspecified: Secondary | ICD-10-CM | POA: Diagnosis not present

## 2019-09-01 DIAGNOSIS — Z1389 Encounter for screening for other disorder: Secondary | ICD-10-CM | POA: Diagnosis not present

## 2019-09-01 DIAGNOSIS — Z131 Encounter for screening for diabetes mellitus: Secondary | ICD-10-CM | POA: Diagnosis not present

## 2019-09-02 DIAGNOSIS — Z1159 Encounter for screening for other viral diseases: Secondary | ICD-10-CM | POA: Diagnosis not present

## 2019-09-02 DIAGNOSIS — Z20828 Contact with and (suspected) exposure to other viral communicable diseases: Secondary | ICD-10-CM | POA: Diagnosis not present

## 2019-09-09 DIAGNOSIS — Z1159 Encounter for screening for other viral diseases: Secondary | ICD-10-CM | POA: Diagnosis not present

## 2019-09-09 DIAGNOSIS — Z20828 Contact with and (suspected) exposure to other viral communicable diseases: Secondary | ICD-10-CM | POA: Diagnosis not present

## 2019-09-23 DIAGNOSIS — Z1159 Encounter for screening for other viral diseases: Secondary | ICD-10-CM | POA: Diagnosis not present

## 2019-09-23 DIAGNOSIS — Z20828 Contact with and (suspected) exposure to other viral communicable diseases: Secondary | ICD-10-CM | POA: Diagnosis not present

## 2019-09-30 DIAGNOSIS — D649 Anemia, unspecified: Secondary | ICD-10-CM | POA: Diagnosis not present

## 2019-10-13 DIAGNOSIS — D649 Anemia, unspecified: Secondary | ICD-10-CM

## 2019-10-13 HISTORY — DX: Anemia, unspecified: D64.9

## 2019-10-21 DIAGNOSIS — Z20828 Contact with and (suspected) exposure to other viral communicable diseases: Secondary | ICD-10-CM | POA: Diagnosis not present

## 2019-10-21 DIAGNOSIS — Z1159 Encounter for screening for other viral diseases: Secondary | ICD-10-CM | POA: Diagnosis not present

## 2019-10-28 DIAGNOSIS — Z20828 Contact with and (suspected) exposure to other viral communicable diseases: Secondary | ICD-10-CM | POA: Diagnosis not present

## 2019-10-28 DIAGNOSIS — Z1159 Encounter for screening for other viral diseases: Secondary | ICD-10-CM | POA: Diagnosis not present

## 2019-11-04 DIAGNOSIS — Z20828 Contact with and (suspected) exposure to other viral communicable diseases: Secondary | ICD-10-CM | POA: Diagnosis not present

## 2019-11-04 DIAGNOSIS — Z1159 Encounter for screening for other viral diseases: Secondary | ICD-10-CM | POA: Diagnosis not present

## 2019-11-11 DIAGNOSIS — Z1159 Encounter for screening for other viral diseases: Secondary | ICD-10-CM | POA: Diagnosis not present

## 2019-11-11 DIAGNOSIS — Z20828 Contact with and (suspected) exposure to other viral communicable diseases: Secondary | ICD-10-CM | POA: Diagnosis not present

## 2019-12-23 DIAGNOSIS — Z20828 Contact with and (suspected) exposure to other viral communicable diseases: Secondary | ICD-10-CM | POA: Diagnosis not present

## 2019-12-23 DIAGNOSIS — Z1159 Encounter for screening for other viral diseases: Secondary | ICD-10-CM | POA: Diagnosis not present

## 2019-12-23 NOTE — Patient Instructions (Addendum)
Get your Covid test at [facility] in [place] on 8/21 at 11:30 am   [place] procedure is scheduled on  01/06/20   Report to [facility]  at   5:30 A.M.   Call this number if you have problems the morning of [phone]   OUR ADDRESS IS [place], WE ARE LOCATED IN THE [facility] WITH [facility].   Remember:  Do not eat food after midnight.  .  You may have clear liquid until 4:30 AM.    At 4:30 AM drink pre surgery drink  . Nothing by mouth after 4:30 AM.    Take these medicines the morning of surgery with A SIP OF WATER: Citalopram, Caritin, Levothyroxine, Prilosec   Do not wear jewelry, make-up or nail polish.  Do not wear lotions, powders, or perfumes, or deoderant.  Do not shave 48 hours prior to surgery.    Do not bring valuables to the hospital.  [facility] is not responsible for any belongings or valuables.  Contacts, dentures or bridgework may not be worn into surgery.   For patients admitted to the hospital, discharge time will be determined by your treatment team.  Patients discharged the day of surgery will not be allowed to drive home.   Special instructions:    Please read over the following fact sheets that you were given:       [facility] - Preparing for Surgery Before surgery, you can play an important role.  Because skin is not sterile, your skin needs to be as free of germs as possible.  You can reduce the number of germs on your skin by washing with CHG (chlorahexidine gluconate) soap before surgery.  CHG is an antiseptic cleaner which kills germs and bonds with the skin to continue killing germs even after washing. Please DO NOT use if you have an allergy to CHG or antibacterial soaps.  If your skin becomes reddened/irritated stop using the CHG and inform your nurse when you arrive at Short Stay. Do not shave (including legs and underarms) for at least 48 hours prior to  the first CHG shower.   Please follow these instructions carefully:  1.  Shower with CHG Soap the night before surgery and the  morning of Surgery.  2.  If you choose to wash your hair, wash your hair first as usual with your  normal  shampoo.  3.  After you shampoo, rinse your hair and body thoroughly to remove the  shampoo.                                        4.  Use CHG as you would any other liquid soap.  You can apply chg directly  to the skin and wash                       Gently with a scrungie or clean washcloth.  5.  Apply the CHG Soap to your body ONLY FROM THE NECK DOWN.   Do not use on face/ open                           Wound or open sores. Avoid contact with eyes, ears mouth and genitals (private parts).  Wash face,  Genitals (private parts) with your normal soap.             6.  Wash thoroughly, paying special attention to the area where your surgery  will be performed.  7.  Thoroughly rinse your body with warm water from the neck down.  8.  DO NOT shower/wash with your normal soap after using and rinsing off  the CHG Soap.             9.  Pat yourself dry with a clean towel.            10.  Wear clean pajamas.            11.  Place clean sheets on your bed the night of your first shower and do not  sleep with pets. Day of Surgery : Do not apply any lotions/deodorants the morning of surgery.  Please wear clean clothes to the hospital/surgery center.  FAILURE TO FOLLOW THESE INSTRUCTIONS MAY RESULT IN THE CANCELLATION OF YOUR SURGERY PATIENT SIGNATURE_________________________________  NURSE SIGNATURE__________________________________  ________________________________________________________________________

## 2019-12-24 ENCOUNTER — Encounter (HOSPITAL_COMMUNITY)
Admission: RE | Admit: 2019-12-24 | Discharge: 2019-12-24 | Disposition: A | Discharge status: Home / Self Care | Payer: BC Managed Care – PPO | Source: Ambulatory Visit | Attending: Obstetrics & Gynecology | Admitting: Obstetrics & Gynecology

## 2019-12-24 ENCOUNTER — Other Ambulatory Visit: Payer: Self-pay

## 2019-12-24 ENCOUNTER — Encounter (HOSPITAL_COMMUNITY): Payer: Self-pay

## 2019-12-24 DIAGNOSIS — Z01818 Encounter for other preprocedural examination: Secondary | ICD-10-CM | POA: Insufficient documentation

## 2019-12-24 DIAGNOSIS — I10 Essential (primary) hypertension: Secondary | ICD-10-CM | POA: Insufficient documentation

## 2019-12-24 HISTORY — DX: Hypothyroidism, unspecified: E03.9

## 2019-12-24 HISTORY — DX: Cardiac murmur, unspecified: R01.1

## 2019-12-24 HISTORY — DX: Gastro-esophageal reflux disease without esophagitis: K21.9

## 2019-12-24 LAB — BASIC METABOLIC PANEL
Anion gap: 10 (ref 5–15)
BUN: 10 mg/dL (ref 6–20)
CO2: 27 mmol/L (ref 22–32)
Calcium: 9.3 mg/dL (ref 8.9–10.3)
Chloride: 104 mmol/L (ref 98–111)
Creatinine, Ser: 0.92 mg/dL (ref 0.44–1.00)
GFR calc Af Amer: >60 mL/min (ref >60)
GFR calc non Af Amer: >60 mL/min (ref >60)
Glucose, Bld: 91 mg/dL (ref 70–99)
Potassium: 3.4 mmol/L — ABNORMAL LOW (ref 3.5–5.1)
Sodium: 141 mmol/L (ref 135–145)

## 2019-12-24 LAB — TYPE AND SCREEN
ABO/RH(D): O POS
Antibody Screen: NEGATIVE

## 2019-12-24 LAB — CBC
HCT: 43.9 % (ref 36.0–46.0)
Hemoglobin: 13.5 g/dL (ref 12.0–15.0)
MCH: 27 pg (ref 26.0–34.0)
MCHC: 30.8 g/dL (ref 30.0–36.0)
MCV: 87.8 fL (ref 80.0–100.0)
Platelets: 434 10*3/uL — ABNORMAL HIGH (ref 150–400)
RBC: 5 MIL/uL (ref 3.87–5.11)
RDW: 13.2 % (ref 11.5–15.5)
WBC: 6.1 10*3/uL (ref 4.0–10.5)
nRBC: 0 % (ref 0.0–0.2)

## 2019-12-24 LAB — HEMOGLOBIN A1C
Hgb A1c MFr Bld: 4.5 % — ABNORMAL LOW (ref 4.8–5.6)
Mean Plasma Glucose: 82.45 mg/dL

## 2019-12-24 NOTE — H&P (Addendum)
Samantha Robinson is an 44 y.o. female. History of HTN/hypothyropidism G1P1. Seen in clinic 12/24/19 at [facility] for preop RATLH/BS/Cysto for menorrhagia 2/2 fibroids. Patient has been known to have fibroids, previously tried OCPs but then diagnosed with HTN and transitioned to POPs. Desired to switch to IUD due to irregular bleeding and mood changed. IUD and gyn scan 06/2018. Mirena IUD placed 06/19/2018. She was then seen 08/06/2019, reported regular cycles for the past 6-8 months for 10-14 days of bleeding. [place] 08/06/2019 showed no IUD and f/u x ray showed no IUD.  Patient currently on northindrone 5mg  QD with some spotting, desires definitive management with hysterectomy.   Allergies: Reports nausea with PCN. No anaphylaxis  Pmhx: HTN, hypothyrodism, anemia Pshx: laparoscopic BTL Reports nausea/vomiting with anesthesia, no other complications.  Pgyn hx: History of CT and trich. No abnormal pap smear. Last pap 12/21/2015 NILM, HPV Neg 09/01/2019 EMB Benign  [place] 08/06/2019 Uterus 11.99x7.11x4.49cm Fibroids: #1 Right anterior subserosal 3.69x3.46x3.47cm #2 Left subserosal 4.84x5.29x4.95cm #3 Right posterior subserosal 5.17x4.99x4.4cm #4 Left posterior subserosal 3.3x4x3.01cm Ovaries not visualized, adnexa appear WNL.   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 She has never used smokeless tobacco. She reports current alcohol use. She reports that she does not use drugs.  Allergies:  Allergies  Allergen Reactions   Penicillins Nausea Only    Has patient had a PCN reaction causing immediate rash, facial/tongue/throat swelling, SOB or  lightheadedness with hypotension: No Has patient had a PCN reaction causing severe rash involving mucus membranes or skin necrosis: No Has patient had a PCN reaction that required hospitalization: No Has patient had a PCN reaction occurring within the last 10 years: No If all of the above answers are "NO", then may proceed with Cephalosporin use.    No medications prior to admission.    Review of Systems - per HPI all other pertinent ROS neg  There were no vitals taken for this visit. Physical Exam  08/06/2019 Annual Exam [facility]  Chaperone Chaperone: present  Constitutional *General Appearance: overweight  Head Head: normocephalic  Lungs *Respiratory Effort: no accessory muscle usage Inspection: normal, normal respiratory rate  Abdomen *Inspection/Palpation/Auscultation: non-distended, no tenderness, no rebound, no guarding, soft  Female Genitalia Vulva: no atrophy Mons: normal, no masses Labia Majora: normal Labia Minora: normal Introitus: normal *Vagina: no erythema, no ulcers, no swelling, no masses, blood present *Cervix: grossly normal, no lesions, blood present *Uterus: mobile, non-tender, contour irregular, enlarged 12 weeks size *Urethral Meatus/ Urethra: normal meatus, no discharge *Bladder: non-distended *Adnexa/Parametria: no mass palpable, no tenderness  Skin *Appearance: no rashes, no lesions  Psychiatric *Orientation: to person, to place, to time *Mood and Affect: active and alert, normal mood, normal affect  Results for orders placed or performed during the hospital encounter of 12/24/19 (from the past 24 hour(s))  Basic metabolic panel     Status: Abnormal   Collection Time: 12/24/19  2:00 PM  Result Value Ref Range   Sodium 141 135 - 145 mmol/L   Potassium 3.4 (L) 3.5 - 5.1 mmol/L  Chloride 104 98 - 111 mmol/L   CO2 27 22 - 32 mmol/L   Glucose, Bld 91 70 - 99 mg/dL   BUN 10 6 - 20 mg/dL   Creatinine, Ser 0.92 0.44 - 1.00 mg/dL    Calcium 9.3 8.9 - 10.3 mg/dL   GFR calc non Af Amer >60 >60 mL/min   GFR calc Af Amer >60 >60 mL/min   Anion gap 10 5 - 15  CBC     Status: Abnormal   Collection Time: 12/24/19  2:00 PM  Result Value Ref Range   WBC 6.1 4.0 - 10.5 K/uL   RBC 5.00 3.87 - 5.11 MIL/uL   Hemoglobin 13.5 12.0 - 15.0 g/dL   HCT 43.9 36 - 46 %   MCV 87.8 80.0 - 100.0 fL   MCH 27.0 26.0 - 34.0 pg   MCHC 30.8 30.0 - 36.0 g/dL   RDW 13.2 11.5 - 15.5 %   Platelets 434 (H) 150 - 400 K/uL   nRBC 0.0 0.0 - 0.2 %    No results found.  Assessment/Plan: 44yo with HTN/hypothyroidism, fibroids, menorrhagia, plan for RATLH/BS/Cysto on 01/06/2020.  1. Hysterectomy: The nature of the procedure was discussed with the patient in detail. An informed discussion was held regarding the risks and benefits of surgical intervention. Specifically, the patient was apprised of risks of pain, bleeding requiring blood transfusion, infection requiring antibiotics, injury to nearby organs (bowel, bladder, nerves, blood vessels, ureter), need for laparotomy to complete the operation, or failure to achieve desired results. She was informed of the low but real risk of these complications, and understands that the alternative is no surgery. An opportunity to ask questions was provided, and all questions were answered to the patient's satisfaction. Patient expresses understanding of these issues, and agrees to proceed with the plan outlined above. The expected post-operative recovery course was discussed with the patient, and post-operative instructions were reviewed. -Ok for ancef for antibiotics -She thought PCP sent over A1c, I did not recieve it. Will check with hospital labs today at [facility]. -Surgery 8/25, Postop visit 9/9 2. Anemia: Hgb 8.8 08/06/2019, on PO iron. Recheck today 13.5  Lathon Adan K Taam-Akelman 12/24/2019, 3:09 PM

## 2019-12-24 NOTE — Progress Notes (Signed)
COVID Vaccine Completed:Yes Date COVID Vaccine completed:06/26/19 COVID vaccine manufacturer:   Moderna     PCP - Dr. Tommie Ard Cardiologist - no  Chest x-ray - 08/12/19 EKG - 12/24/19 Stress Test - no ECHO - no Cardiac Cath - no  Sleep Study - no CPAP -   Fasting Blood Sugar - NA Checks Blood Sugar _____ times a day  Blood Thinner Instructions:NA Aspirin Instructions: Last Dose:  Anesthesia review:   Patient denies shortness of breath, fever, cough and chest pain at PAT appointment yes   Patient verbalized understanding of instructions that were given to them at the PAT appointment. Patient was also instructed that they will need to review over the PAT instructions again at home before surgery. Yes  Pt states that she can climb 2 flights of stairs before feeling SOB. She feels that it is due to her weight. BMI is 42.1

## 2020-01-02 ENCOUNTER — Other Ambulatory Visit (HOSPITAL_COMMUNITY)
Admission: RE | Admit: 2020-01-02 | Discharge: 2020-01-02 | Disposition: A | Discharge status: Home / Self Care | Payer: BC Managed Care – PPO | Source: Ambulatory Visit | Attending: Obstetrics & Gynecology | Admitting: Obstetrics & Gynecology

## 2020-01-02 DIAGNOSIS — Z01812 Encounter for preprocedural laboratory examination: Secondary | ICD-10-CM | POA: Diagnosis not present

## 2020-01-02 DIAGNOSIS — Z20822 Contact with and (suspected) exposure to covid-19: Secondary | ICD-10-CM | POA: Insufficient documentation

## 2020-01-02 LAB — SARS CORONAVIRUS 2 (TAT 6-24 HRS): SARS Coronavirus 2: NEGATIVE

## 2020-01-06 ENCOUNTER — Encounter (HOSPITAL_BASED_OUTPATIENT_CLINIC_OR_DEPARTMENT_OTHER)
Admission: RE | Disposition: A | Discharge status: Home / Self Care | Payer: Self-pay | Source: Home / Self Care | Attending: Obstetrics & Gynecology

## 2020-01-06 ENCOUNTER — Ambulatory Visit (HOSPITAL_BASED_OUTPATIENT_CLINIC_OR_DEPARTMENT_OTHER): Payer: BC Managed Care – PPO | Admitting: Certified Registered"

## 2020-01-06 ENCOUNTER — Ambulatory Visit (HOSPITAL_BASED_OUTPATIENT_CLINIC_OR_DEPARTMENT_OTHER)
Admission: RE | Admit: 2020-01-06 | Discharge: 2020-01-06 | Disposition: A | Discharge status: Home / Self Care | Payer: BC Managed Care – PPO | Attending: Obstetrics & Gynecology | Admitting: Obstetrics & Gynecology

## 2020-01-06 ENCOUNTER — Ambulatory Visit (HOSPITAL_BASED_OUTPATIENT_CLINIC_OR_DEPARTMENT_OTHER): Payer: BC Managed Care – PPO | Admitting: Physician Assistant

## 2020-01-06 ENCOUNTER — Encounter (HOSPITAL_BASED_OUTPATIENT_CLINIC_OR_DEPARTMENT_OTHER): Payer: Self-pay | Admitting: Obstetrics & Gynecology

## 2020-01-06 DIAGNOSIS — Z880 Allergy status to penicillin: Secondary | ICD-10-CM | POA: Insufficient documentation

## 2020-01-06 DIAGNOSIS — N920 Excessive and frequent menstruation with regular cycle: Secondary | ICD-10-CM | POA: Insufficient documentation

## 2020-01-06 DIAGNOSIS — F419 Anxiety disorder, unspecified: Secondary | ICD-10-CM | POA: Diagnosis not present

## 2020-01-06 DIAGNOSIS — D251 Intramural leiomyoma of uterus: Secondary | ICD-10-CM | POA: Diagnosis not present

## 2020-01-06 DIAGNOSIS — Z6841 Body Mass Index (BMI) 40.0 and over, adult: Secondary | ICD-10-CM | POA: Diagnosis not present

## 2020-01-06 DIAGNOSIS — E039 Hypothyroidism, unspecified: Secondary | ICD-10-CM | POA: Diagnosis not present

## 2020-01-06 DIAGNOSIS — D252 Subserosal leiomyoma of uterus: Secondary | ICD-10-CM | POA: Insufficient documentation

## 2020-01-06 DIAGNOSIS — D259 Leiomyoma of uterus, unspecified: Secondary | ICD-10-CM | POA: Diagnosis not present

## 2020-01-06 DIAGNOSIS — Z90710 Acquired absence of both cervix and uterus: Secondary | ICD-10-CM | POA: Diagnosis present

## 2020-01-06 DIAGNOSIS — D649 Anemia, unspecified: Secondary | ICD-10-CM | POA: Diagnosis not present

## 2020-01-06 DIAGNOSIS — N72 Inflammatory disease of cervix uteri: Secondary | ICD-10-CM | POA: Diagnosis not present

## 2020-01-06 DIAGNOSIS — I10 Essential (primary) hypertension: Secondary | ICD-10-CM | POA: Diagnosis not present

## 2020-01-06 DIAGNOSIS — D219 Benign neoplasm of connective and other soft tissue, unspecified: Secondary | ICD-10-CM | POA: Diagnosis present

## 2020-01-06 HISTORY — PX: ROBOTIC ASSISTED LAPAROSCOPIC HYSTERECTOMY AND SALPINGECTOMY: SHX6379

## 2020-01-06 HISTORY — PX: CYSTOSCOPY: SHX5120

## 2020-01-06 LAB — POCT PREGNANCY, URINE: Preg Test, Ur: NEGATIVE

## 2020-01-06 LAB — ABO/RH: ABO/RH(D): O POS

## 2020-01-06 SURGERY — XI ROBOTIC ASSISTED LAPAROSCOPIC HYSTERECTOMY AND SALPINGECTOMY
Anesthesia: General | Site: Abdomen

## 2020-01-06 MED ORDER — KETAMINE HCL 10 MG/ML IJ SOLN
INTRAMUSCULAR | Status: DC | PRN
  Administered 2020-01-06 (×3): 10 mg via INTRAVENOUS

## 2020-01-06 MED ORDER — ROCURONIUM BROMIDE 100 MG/10ML IV SOLN
INTRAVENOUS | Status: DC | PRN
  Administered 2020-01-06: 100 mg via INTRAVENOUS

## 2020-01-06 MED ORDER — SUGAMMADEX SODIUM 200 MG/2ML IV SOLN
INTRAVENOUS | Status: DC | PRN
  Administered 2020-01-06: 200 mg via INTRAVENOUS

## 2020-01-06 MED ORDER — ONDANSETRON HCL 4 MG/2ML IJ SOLN: 4.0000 mg | Freq: Four times a day (QID) | INTRAMUSCULAR | Status: DC | PRN

## 2020-01-06 MED ORDER — ACETAMINOPHEN 500 MG PO TABS
ORAL_TABLET | ORAL | Status: AC
  Filled 2020-01-06: qty 2

## 2020-01-06 MED ORDER — GABAPENTIN 300 MG PO CAPS
300.0000 mg | ORAL_CAPSULE | ORAL | Status: AC
  Administered 2020-01-06: 06:00:00 300 mg via ORAL

## 2020-01-06 MED ORDER — GLYCOPYRROLATE 0.2 MG/ML IJ SOLN
INTRAMUSCULAR | Status: DC | PRN
  Administered 2020-01-06: .1 mg via INTRAVENOUS

## 2020-01-06 MED ORDER — PROPOFOL 10 MG/ML IV BOLUS
INTRAVENOUS | Status: DC | PRN
  Administered 2020-01-06: 200 mg via INTRAVENOUS

## 2020-01-06 MED ORDER — EPHEDRINE SULFATE-NACL 50-0.9 MG/10ML-% IV SOSY
PREFILLED_SYRINGE | INTRAVENOUS | Status: DC | PRN
  Administered 2020-01-06: 10 mg via INTRAVENOUS
  Administered 2020-01-06: 5 mg via INTRAVENOUS

## 2020-01-06 MED ORDER — CEFAZOLIN SODIUM-DEXTROSE 2-4 GM/100ML-% IV SOLN
2.0000 g | INTRAVENOUS | Status: AC
  Administered 2020-01-06: 08:00:00 2 g via INTRAVENOUS

## 2020-01-06 MED ORDER — ONDANSETRON HCL 4 MG/2ML IJ SOLN
INTRAMUSCULAR | Status: DC | PRN
  Administered 2020-01-06 (×2): 4 mg via INTRAVENOUS

## 2020-01-06 MED ORDER — KETOROLAC TROMETHAMINE 15 MG/ML IJ SOLN: 15.0000 mg | INTRAMUSCULAR | Status: DC

## 2020-01-06 MED ORDER — INDIGOTINDISULFONATE SODIUM 8 MG/ML IJ SOLN
INTRAMUSCULAR | Status: DC | PRN
  Administered 2020-01-06: 5 mL via INTRAVENOUS

## 2020-01-06 MED ORDER — FENTANYL CITRATE (PF) 250 MCG/5ML IJ SOLN
INTRAMUSCULAR | Status: AC
  Filled 2020-01-06: qty 5

## 2020-01-06 MED ORDER — ROCURONIUM BROMIDE 10 MG/ML (PF) SYRINGE
PREFILLED_SYRINGE | INTRAVENOUS | Status: AC
  Filled 2020-01-06: qty 10

## 2020-01-06 MED ORDER — OXYCODONE-ACETAMINOPHEN 5-325 MG PO TABS
1.0000 | ORAL_TABLET | Freq: Four times a day (QID) | ORAL | 0 refills | Status: DC | PRN
Start: 2020-01-06 — End: 2020-01-06

## 2020-01-06 MED ORDER — LIDOCAINE 2% (20 MG/ML) 5 ML SYRINGE
INTRAMUSCULAR | Status: AC
  Filled 2020-01-06: qty 5

## 2020-01-06 MED ORDER — ACETAMINOPHEN 500 MG PO TABS
1000.0000 mg | ORAL_TABLET | ORAL | Status: AC
  Administered 2020-01-06: 1000 mg via ORAL

## 2020-01-06 MED ORDER — MIDAZOLAM HCL 5 MG/5ML IJ SOLN
INTRAMUSCULAR | Status: DC | PRN
  Administered 2020-01-06: 2 mg via INTRAVENOUS

## 2020-01-06 MED ORDER — FENTANYL CITRATE (PF) 100 MCG/2ML IJ SOLN: 25.0000 ug | INTRAMUSCULAR | Status: DC | PRN

## 2020-01-06 MED ORDER — LIDOCAINE HCL (PF) 2 % IJ SOLN
INTRAMUSCULAR | Status: DC | PRN
  Administered 2020-01-06: 1.5 mg/kg/h via INTRADERMAL

## 2020-01-06 MED ORDER — OXYCODONE HCL 5 MG PO TABS
ORAL_TABLET | ORAL | Status: AC
  Filled 2020-01-06: qty 1

## 2020-01-06 MED ORDER — EPHEDRINE 5 MG/ML INJ
INTRAVENOUS | Status: AC
  Filled 2020-01-06: qty 10

## 2020-01-06 MED ORDER — ONDANSETRON HCL 4 MG/2ML IJ SOLN
INTRAMUSCULAR | Status: AC
  Filled 2020-01-06: qty 2

## 2020-01-06 MED ORDER — DEXAMETHASONE SODIUM PHOSPHATE 10 MG/ML IJ SOLN
INTRAMUSCULAR | Status: AC
  Filled 2020-01-06: qty 1

## 2020-01-06 MED ORDER — DOCUSATE SODIUM 100 MG PO CAPS
100.0000 mg | ORAL_CAPSULE | Freq: Two times a day (BID) | ORAL | Status: DC
  Administered 2020-01-06: 100 mg via ORAL

## 2020-01-06 MED ORDER — FENTANYL CITRATE (PF) 100 MCG/2ML IJ SOLN
INTRAMUSCULAR | Status: DC | PRN
  Administered 2020-01-06: 50 ug via INTRAVENOUS
  Administered 2020-01-06: 100 ug via INTRAVENOUS
  Administered 2020-01-06 (×2): 50 ug via INTRAVENOUS

## 2020-01-06 MED ORDER — LACTATED RINGERS IV SOLN: INTRAVENOUS | Status: DC

## 2020-01-06 MED ORDER — SIMETHICONE 80 MG PO CHEW: 40.0000 mg | CHEWABLE_TABLET | Freq: Four times a day (QID) | ORAL | Status: DC | PRN

## 2020-01-06 MED ORDER — KETOROLAC TROMETHAMINE 30 MG/ML IJ SOLN
INTRAMUSCULAR | Status: AC
  Filled 2020-01-06: qty 1

## 2020-01-06 MED ORDER — GABAPENTIN 300 MG PO CAPS
ORAL_CAPSULE | ORAL | Status: AC
  Filled 2020-01-06: qty 1

## 2020-01-06 MED ORDER — SCOPOLAMINE 1 MG/3DAYS TD PT72
1.0000 | MEDICATED_PATCH | TRANSDERMAL | Status: DC
  Administered 2020-01-06: 1.5 mg via TRANSDERMAL

## 2020-01-06 MED ORDER — GABAPENTIN 300 MG PO CAPS: 300.0000 mg | ORAL_CAPSULE | Freq: Two times a day (BID) | ORAL | Status: DC

## 2020-01-06 MED ORDER — DEXAMETHASONE SODIUM PHOSPHATE 10 MG/ML IJ SOLN
INTRAMUSCULAR | Status: DC | PRN
  Administered 2020-01-06: 10 mg via INTRAVENOUS

## 2020-01-06 MED ORDER — IBUPROFEN 800 MG PO TABS: 800.0000 mg | ORAL_TABLET | Freq: Three times a day (TID) | ORAL | 0 refills | Status: DC | PRN

## 2020-01-06 MED ORDER — CEFAZOLIN SODIUM-DEXTROSE 2-4 GM/100ML-% IV SOLN
INTRAVENOUS | Status: AC
  Filled 2020-01-06: qty 100

## 2020-01-06 MED ORDER — ONDANSETRON 4 MG PO TBDP: 4.0000 mg | ORAL_TABLET | Freq: Four times a day (QID) | ORAL | Status: DC | PRN

## 2020-01-06 MED ORDER — KETOROLAC TROMETHAMINE 30 MG/ML IJ SOLN
INTRAMUSCULAR | Status: DC | PRN
  Administered 2020-01-06: 30 mg via INTRAVENOUS

## 2020-01-06 MED ORDER — SIMETHICONE 80 MG PO CHEW: 80.0000 mg | CHEWABLE_TABLET | Freq: Four times a day (QID) | ORAL | 0 refills | Status: DC | PRN

## 2020-01-06 MED ORDER — LIDOCAINE 2% (20 MG/ML) 5 ML SYRINGE
INTRAMUSCULAR | Status: DC | PRN
  Administered 2020-01-06: 100 mg via INTRAVENOUS

## 2020-01-06 MED ORDER — DOCUSATE SODIUM 100 MG PO CAPS
ORAL_CAPSULE | ORAL | Status: AC
  Filled 2020-01-06: qty 1

## 2020-01-06 MED ORDER — SODIUM CHLORIDE 0.9 % IR SOLN
Status: DC | PRN
  Administered 2020-01-06: 3000 mL

## 2020-01-06 MED ORDER — KETOROLAC TROMETHAMINE 15 MG/ML IJ SOLN: 15.0000 mg | Freq: Four times a day (QID) | INTRAMUSCULAR | Status: DC

## 2020-01-06 MED ORDER — SODIUM CHLORIDE (PF) 0.9 % IJ SOLN
INTRAMUSCULAR | Status: DC | PRN
  Administered 2020-01-06: 30 mL

## 2020-01-06 MED ORDER — GLYCOPYRROLATE PF 0.2 MG/ML IJ SOSY
PREFILLED_SYRINGE | INTRAMUSCULAR | Status: AC
  Filled 2020-01-06: qty 1

## 2020-01-06 MED ORDER — OXYCODONE-ACETAMINOPHEN 5-325 MG PO TABS: 1.0000 | ORAL_TABLET | Freq: Four times a day (QID) | ORAL | 0 refills | Status: AC | PRN

## 2020-01-06 MED ORDER — SCOPOLAMINE 1 MG/3DAYS TD PT72
MEDICATED_PATCH | TRANSDERMAL | Status: AC
  Filled 2020-01-06: qty 1

## 2020-01-06 MED ORDER — OXYCODONE HCL 5 MG PO TABS
5.0000 mg | ORAL_TABLET | ORAL | Status: DC | PRN
  Administered 2020-01-06: 5 mg via ORAL

## 2020-01-06 MED ORDER — TRAMADOL HCL 50 MG PO TABS: 50.0000 mg | ORAL_TABLET | Freq: Four times a day (QID) | ORAL | Status: DC | PRN

## 2020-01-06 MED ORDER — KETAMINE HCL 10 MG/ML IJ SOLN
INTRAMUSCULAR | Status: AC
  Filled 2020-01-06: qty 1

## 2020-01-06 MED ORDER — MIDAZOLAM HCL 2 MG/2ML IJ SOLN
INTRAMUSCULAR | Status: AC
  Filled 2020-01-06: qty 2

## 2020-01-06 MED ORDER — ROPIVACAINE HCL 5 MG/ML IJ SOLN
INTRAMUSCULAR | Status: DC | PRN
  Administered 2020-01-06: 30 mL

## 2020-01-06 MED ORDER — ACETAMINOPHEN 500 MG PO TABS
1000.0000 mg | ORAL_TABLET | Freq: Four times a day (QID) | ORAL | Status: DC
  Administered 2020-01-06: 1000 mg via ORAL

## 2020-01-06 MED ORDER — HYDROMORPHONE HCL 1 MG/ML IJ SOLN: 0.5000 mg | INTRAMUSCULAR | Status: DC | PRN

## 2020-01-06 MED ORDER — PROPOFOL 10 MG/ML IV BOLUS
INTRAVENOUS | Status: AC
  Filled 2020-01-06: qty 40

## 2020-01-06 MED ORDER — ENSURE PRE-SURGERY PO LIQD: 296.0000 mL | Freq: Once | ORAL | Status: DC

## 2020-01-06 SURGICAL SUPPLY — 56 items
ADH SKN CLS APL DERMABOND .7 (GAUZE/BANDAGES/DRESSINGS) ×2
COVER BACK TABLE 60X90IN (DRAPES) ×4 IMPLANT
COVER SURGICAL LIGHT HANDLE (MISCELLANEOUS) ×4 IMPLANT
COVER TIP SHEARS 8 DVNC (MISCELLANEOUS) ×2 IMPLANT
COVER TIP SHEARS 8MM DA VINCI (MISCELLANEOUS) ×4
DECANTER SPIKE VIAL GLASS SM (MISCELLANEOUS) ×8 IMPLANT
DEFOGGER SCOPE WARMER CLEARIFY (MISCELLANEOUS) ×4 IMPLANT
DERMABOND ADVANCED (GAUZE/BANDAGES/DRESSINGS) ×2
DERMABOND ADVANCED .7 DNX12 (GAUZE/BANDAGES/DRESSINGS) ×2 IMPLANT
DRAPE ARM DVNC X/XI (DISPOSABLE) ×8 IMPLANT
DRAPE COLUMN DVNC XI (DISPOSABLE) ×2 IMPLANT
DRAPE DA VINCI XI ARM (DISPOSABLE) ×16
DRAPE DA VINCI XI COLUMN (DISPOSABLE) ×4
DRAPE UTILITY XL STRL (DRAPES) ×4 IMPLANT
DRSG OPSITE POSTOP 3X4 (GAUZE/BANDAGES/DRESSINGS) ×4 IMPLANT
DURAPREP 26ML APPLICATOR (WOUND CARE) ×4 IMPLANT
ELECT REM PT RETURN 9FT ADLT (ELECTROSURGICAL) ×4
ELECTRODE REM PT RTRN 9FT ADLT (ELECTROSURGICAL) ×2 IMPLANT
GAUZE 4X4 16PLY RFD (DISPOSABLE) ×4 IMPLANT
GLOVE BIO SURGEON STRL SZ 6 (GLOVE) ×16 IMPLANT
GLOVE BIO SURGEON STRL SZ7 (GLOVE) ×12 IMPLANT
GLOVE BIOGEL PI IND STRL 6 (GLOVE) ×6 IMPLANT
GLOVE BIOGEL PI IND STRL 7.0 (GLOVE) ×8 IMPLANT
GLOVE BIOGEL PI INDICATOR 6 (GLOVE) ×6
GLOVE BIOGEL PI INDICATOR 7.0 (GLOVE) ×8
GLOVE NEODERM STER SZ 7 (GLOVE) ×8 IMPLANT
HOLDER FOLEY CATH W/STRAP (MISCELLANEOUS) ×4 IMPLANT
IRRIG SUCT STRYKERFLOW 2 WTIP (MISCELLANEOUS) ×4
IRRIGATION SUCT STRKRFLW 2 WTP (MISCELLANEOUS) ×2 IMPLANT
LEGGING LITHOTOMY PAIR STRL (DRAPES) ×4 IMPLANT
MANIFOLD NEPTUNE II (INSTRUMENTS) ×4 IMPLANT
MANIPULATOR ADVINCU DEL 3.0 PL (MISCELLANEOUS) ×4 IMPLANT
NS IRRIG 500ML POUR BTL (IV SOLUTION) ×4 IMPLANT
OBTURATOR OPTICAL STANDARD 8MM (TROCAR) ×4
OBTURATOR OPTICAL STND 8 DVNC (TROCAR) ×2
OBTURATOR OPTICALSTD 8 DVNC (TROCAR) ×2 IMPLANT
PACK ROBOT WH (CUSTOM PROCEDURE TRAY) ×4 IMPLANT
PACK ROBOTIC GOWN (GOWN DISPOSABLE) ×4 IMPLANT
PACK TRENDGUARD 450 HYBRID PRO (MISCELLANEOUS) ×2 IMPLANT
PAD OB MATERNITY 4.3X12.25 (PERSONAL CARE ITEMS) ×4 IMPLANT
PAD PREP 24X48 CUFFED NSTRL (MISCELLANEOUS) ×4 IMPLANT
PROTECTOR NERVE ULNAR (MISCELLANEOUS) ×8 IMPLANT
SEAL CANN UNIV 5-8 DVNC XI (MISCELLANEOUS) ×6 IMPLANT
SEAL XI 5MM-8MM UNIVERSAL (MISCELLANEOUS) ×12
SET IRRIG Y TYPE TUR BLADDER L (SET/KITS/TRAYS/PACK) ×4 IMPLANT
SET TRI-LUMEN FLTR TB AIRSEAL (TUBING) ×4 IMPLANT
SUT VIC AB 0 CT1 36 (SUTURE) ×4 IMPLANT
SUT VICRYL RAPIDE 3 0 (SUTURE) ×8 IMPLANT
SUT VLOC 180 0 9IN  GS21 (SUTURE) ×4
SUT VLOC 180 0 9IN GS21 (SUTURE) ×2 IMPLANT
TOWEL OR 17X26 10 PK STRL BLUE (TOWEL DISPOSABLE) ×4 IMPLANT
TRAY FOLEY W/BAG SLVR 14FR LF (SET/KITS/TRAYS/PACK) ×4 IMPLANT
TRENDGUARD 450 HYBRID PRO PACK (MISCELLANEOUS) ×4
TROCAR BLADELESS OPT 5 100 (ENDOMECHANICALS) IMPLANT
TROCAR PORT AIRSEAL 5X120 (TROCAR) ×4 IMPLANT
WARMER LAPAROSCOPE (MISCELLANEOUS) ×4 IMPLANT

## 2020-01-06 NOTE — Transfer of Care (Signed)
Immediate Anesthesia Transfer of Care Note  Patient: Samantha Robinson  Procedure(s) Performed: XI ROBOTIC ASSISTED LAPAROSCOPIC HYSTERECTOMY AND SALPINGECTOMY (Bilateral Abdomen) CYSTOSCOPY (N/A )  Patient Location: PACU  Anesthesia Type:General  Level of Consciousness: drowsy  Airway & Oxygen Therapy: Patient Spontanous Breathing and Patient connected to face mask oxygen  Post-op Assessment: Report given to RN and Post -op Vital signs reviewed and stable  Post vital signs: Reviewed and stable  Last Vitals:  Vitals Value Taken Time  BP 146/95 01/06/20 1020  Temp    Pulse    Resp 16 01/06/20 1022  SpO2    Vitals shown include unvalidated device data.  Last Pain:  Vitals:   01/06/20 0607  TempSrc: Oral  PainSc: 0-No pain      Patients Stated Pain Goal: 4 (21/62/44 6950)  Complications: No complications documented.

## 2020-01-06 NOTE — Anesthesia Preprocedure Evaluation (Addendum)
Anesthesia Evaluation  Patient identified by MRN, date of birth, ID band Patient awake    Reviewed: Allergy & Precautions, NPO status , Patient's Chart, lab work & pertinent test results  Airway Mallampati: III  TM Distance: >3 FB Neck ROM: Full  Mouth opening: Limited Mouth Opening  Dental  (+) Dental Advisory Given, Chipped, Teeth Intact,    Pulmonary neg pulmonary ROS,    Pulmonary exam normal breath sounds clear to auscultation       Cardiovascular hypertension, Pt. on medications negative cardio ROS Normal cardiovascular exam Rhythm:Regular Rate:Normal     Neuro/Psych PSYCHIATRIC DISORDERS Anxiety negative neurological ROS     GI/Hepatic Neg liver ROS, GERD  Medicated,  Endo/Other  Hypothyroidism Morbid obesity (BMI 42)  Renal/GU negative Renal ROS  negative genitourinary   Musculoskeletal negative musculoskeletal ROS (+)   Abdominal   Peds  Hematology negative hematology ROS (+)   Anesthesia Other Findings   Reproductive/Obstetrics                           Anesthesia Physical Anesthesia Plan  ASA: III  Anesthesia Plan: General   Post-op Pain Management:    Induction: Intravenous  PONV Risk Score and Plan: 3 and Midazolam, Dexamethasone and Ondansetron  Airway Management Planned: Oral ETT  Additional Equipment:   Intra-op Plan:   Post-operative Plan: Extubation in OR  Informed Consent: I have reviewed the patients History and Physical, chart, labs and discussed the procedure including the risks, benefits and alternatives for the proposed anesthesia with the patient or authorized representative who has indicated his/her understanding and acceptance.     Dental advisory given  Plan Discussed with: CRNA  Anesthesia Plan Comments:         Anesthesia Quick Evaluation

## 2020-01-06 NOTE — Brief Op Note (Addendum)
01/06/2020  10:04 AM  PATIENT:  Samantha Robinson  44 y.o. female  PRE-OPERATIVE DIAGNOSIS:  menorrhagia fibroids  POST-OPERATIVE DIAGNOSIS:  menorrhagia fibroids  PROCEDURE:  Procedure(s): XI ROBOTIC ASSISTED LAPAROSCOPIC HYSTERECTOMY AND SALPINGECTOMY (Bilateral) CYSTOSCOPY (N/A)  SURGEON:  Surgeon(s) and Role:    * Taam-Akelman, Lawrence Santiago, MD - Primary *Bobbye Charleston, MD - assisting  ANESTHESIA:   general  EBL:  25 mL   BLOOD ADMINISTERED:none  DRAINS: none   LOCAL MEDICATIONS USED:  Ropivicaine   SPECIMEN:  Source of Specimen:  cervix, uterus, bilateral fallopian tubes  DISPOSITION OF SPECIMEN:  PATHOLOGY  COUNTS:  YES  TOURNIQUET:  * No tourniquets in log *  DICTATION: .Note written in [place]: Extended recovery with same day discharge if meeting goals  PATIENT DISPOSITION:  PACU - hemodynamically stable.   Delay start of Pharmacological VTE agent (>24hrs) due to surgical blood loss or risk of bleeding: not applicable

## 2020-01-06 NOTE — Anesthesia Procedure Notes (Signed)
Procedure Name: Intubation Date/Time: 01/06/2020 7:46 AM Performed by: Gwyndolyn Saxon, CRNA Pre-anesthesia Checklist: Patient identified, Emergency Drugs available, Suction available and Patient being monitored Patient Re-evaluated:Patient Re-evaluated prior to induction Oxygen Delivery Method: Circle system utilized Preoxygenation: Pre-oxygenation with 100% oxygen Induction Type: IV induction Ventilation: Mask ventilation without difficulty Laryngoscope Size: Miller and 2 Grade View: Grade III Tube type: Oral Tube size: 7.0 mm Number of attempts: 1 Airway Equipment and Method: Patient positioned with wedge pillow and Stylet Placement Confirmation: positive ETCO2 and breath sounds checked- equal and bilateral Secured at: 21 cm Tube secured with: Tape Dental Injury: Teeth and Oropharynx as per pre-operative assessment  Difficulty Due To: Difficult Airway- due to anterior larynx

## 2020-01-06 NOTE — Discharge Instructions (Signed)
Prescriptions Motrin 800mg  every 8 hours for pain Acetaminophen 650mg  every 6 hours for moderate pain Percocet (Oxycodone 5mg -acetaminophen 325mg ) every 4 hours for severe pain.  Make sure to not exceed acetaminophen 3000mg  every day. Take stool softener daily to make sure you are not constipated or straining for a bowel movement  Robotic/laparoscopic Assisted Vaginal Hysterectomy, Care After This sheet gives you information about how to care for yourself after your procedure. Your health care provider may also give you more specific instructions. If you have problems or questions, contact your health care provider. What can I expect after the procedure? After the procedure, it is common to have:  Soreness and numbness in your incision areas.  Abdominal pain. You will be given pain medicine to control it.  Vaginal bleeding and discharge. You will need to use a sanitary napkin after this procedure.  Sore throat from the breathing tube that was inserted during surgery. Follow these instructions at home: Medicines  Take over-the-counter and prescription medicines only as told by your health care provider.  Do not take aspirin or ibuprofen. These medicines can cause bleeding.  Do not drive or use heavy machinery while taking prescription pain medicine.  Do not drive for 24 hours if you were given a medicine to help you relax (sedative) during the procedure. Incision care   Follow instructions from your health care provider about how to take care of your incisions. Make sure you: ? Wash your hands with soap and water before you change your bandage (dressing). If soap and water are not available, use hand sanitizer. ? Change your dressing as told by your health care provider. ? Leave stitches (sutures), skin glue, or adhesive strips in place. These skin closures may need to stay in place for 2 weeks or longer. If adhesive strip edges start to loosen and curl up, you may trim the loose  edges. Do not remove adhesive strips completely unless your health care provider tells you to do that.  Check your incision area every day for signs of infection. Check for: ? Redness, swelling, or pain. ? Fluid or blood. ? Warmth. ? Pus or a bad smell. Activity  Get regular exercise as told by your health care provider. You may be told to take short walks every day and go farther each time.  Return to your normal activities as told by your health care provider. Ask your health care provider what activities are safe for you.  Do not douche, use tampons, or have sexual intercourse for at least 6 weeks, or until your health care provider gives you permission.  Do not lift anything that is heavier than 10 lb (4.5 kg), or the limit that your health care provider tells you, until he or she says that it is safe. General instructions  Do not take baths, swim, or use a hot tub until your health care provider approves. Take showers instead of baths.  Do not drive for 24 hours if you received a sedative.  Do not drive or operate heavy machinery while taking prescription pain medicine.  To prevent or treat constipation while you are taking prescription pain medicine, your health care provider may recommend that you: ? Drink enough fluid to keep your urine clear or pale yellow. ? Take over-the-counter or prescription medicines. ? Eat foods that are high in fiber, such as fresh fruits and vegetables, whole grains, and beans. ? Limit foods that are high in fat and processed sugars, such as fried and sweet foods.  Keep all follow-up visits as told by your health care provider. This is important. Contact a health care provider if:  You have signs of infection, such as: ? Redness, swelling, or pain around your incision sites. ? Fluid or blood coming from an incision. ? An incision that feels warm to the touch. ? Pus or a bad smell coming from an incision.  Your incision breaks open.  Your  pain medicine is not helping.  You feel dizzy or light-headed.  You have pain or bleeding when you urinate.  You have persistent nausea and vomiting.  You have blood, pus, or a bad-smelling discharge from your vagina. Get help right away if:  You have a fever.  You have severe abdominal pain.  You have chest pain.  You have shortness of breath.  You faint.  You have pain, swelling, or redness in your leg.  You have heavy bleeding from your vagina. Summary  After the procedure, it is common to have abdominal pain and vaginal bleeding.  You should not drive or lift heavy objects until your health care provider says that it is safe.  Contact your health care provider if you have any symptoms of infection, excessive vaginal bleeding, nausea, vomiting, or shortness of breath. This information is not intended to replace advice given to you by your health care provider. Make sure you discuss any questions you have with your health care provider. Document Revised: 04/12/2017 Document Reviewed: 06/26/2016 Elsevier Patient Education  [place] Instructions  Activity: Get plenty of rest for the remainder of the day. A responsible individual must stay with you for 24 hours following the procedure.  For the next 24 hours, DO NOT: -Drive a car -Paediatric nurse -Drink alcoholic beverages -Take any medication unless instructed by your physician -Make any legal decisions or sign important papers.  Meals: Start with liquid foods such as gelatin or soup. Progress to regular foods as tolerated. Avoid greasy, spicy, heavy foods. If nausea and/or vomiting occur, drink only clear liquids until the nausea and/or vomiting subsides. Call your physician if vomiting continues.  Special Instructions/Symptoms: Your throat may feel dry or sore from the anesthesia or the breathing tube placed in your throat during surgery. If this causes discomfort, gargle  with warm salt water. The discomfort should disappear within 24 hours.  If you had a scopolamine patch placed behind your ear for the management of post- operative nausea and/or vomiting:  1. The medication in the patch is effective for 72 hours, after which it should be removed.  Wrap patch in a tissue and discard in the trash. Wash hands thoroughly with soap and water. 2. You may remove the patch earlier than 72 hours if you experience unpleasant side effects which may include dry mouth, dizziness or visual disturbances. 3. Avoid touching the patch. Wash your hands with soap and water after contact with the patch.

## 2020-01-06 NOTE — Anesthesia Postprocedure Evaluation (Signed)
Anesthesia Post Note  Patient: Samantha Robinson  Procedure(s) Performed: XI ROBOTIC ASSISTED LAPAROSCOPIC HYSTERECTOMY AND SALPINGECTOMY (Bilateral Abdomen) CYSTOSCOPY (N/A )     Patient location during evaluation: PACU Anesthesia Type: General Level of consciousness: awake and alert Pain management: pain level controlled Vital Signs Assessment: post-procedure vital signs reviewed and stable Respiratory status: spontaneous breathing, nonlabored ventilation, respiratory function stable and patient connected to nasal cannula oxygen Cardiovascular status: blood pressure returned to baseline and stable Postop Assessment: no apparent nausea or vomiting Anesthetic complications: no   No complications documented.  Last Vitals:  Vitals:   01/06/20 1130 01/06/20 1154  BP: (!) 144/88 (!) 143/87  Pulse: 89 87  Resp: 15 14  Temp: 36.5 C (!) 36.2 C  SpO2: 95% 95%    Last Pain:  Vitals:   01/06/20 1154  TempSrc:   PainSc: Asleep                 Dayton Sherr L Jeralyn Nolden

## 2020-01-06 NOTE — Progress Notes (Signed)
AVS discussed with patient.  She has progressed well and is ready for discharge.

## 2020-01-06 NOTE — Interval H&P Note (Signed)
History and Physical Interval Note:  01/06/2020 7:07 AM  Samantha Robinson  has presented today for surgery, with the diagnosis of menorrhagia fibroids.  The various methods of treatment have been discussed with the patient and family. After consideration of risks, benefits and other options for treatment, the patient has consented to  Procedure(s) with comments: XI ROBOTIC ASSISTED LAPAROSCOPIC HYSTERECTOMY AND SALPINGECTOMY (Bilateral) - Dr.Taam requesting 2 hours OR time CYSTOSCOPY (N/A) as a surgical intervention.  The patient's history has been reviewed, patient examined, no change in status, stable for surgery.  I have reviewed the patient's chart and labs.  Questions were answered to the patient's satisfaction.     Drey Shaff K Taam-Akelman

## 2020-01-06 NOTE — Op Note (Addendum)
OPERATIVE NOTE PATIENT: Samantha Robinson 44 y.o.   PRE-OPERATIVE DIAGNOSIS:  Fibroids, menorrhagia   PRE-OPERATIVE DIAGNOSIS:  menorrhagia fibroids  POST-OPERATIVE DIAGNOSIS:  menorrhagia fibroids  PROCEDURE:  Procedure(s): XI ROBOTIC ASSISTED LAPAROSCOPIC HYSTERECTOMY AND SALPINGECTOMY (Bilateral) CYSTOSCOPY (N/A)  SURGEON:  Surgeon(s) and Role:    * Taam-Akelman, Lawrence Santiago, MD - Primary *Bobbye Charleston, MD - assisting  ANESTHESIA:   general  EBL:  25 mL   BLOOD ADMINISTERED:none  DRAINS: none   LOCAL MEDICATIONS USED:  Ropivicaine    SPECIMEN:  Source of Specimen:  cervix, uterus, bilateral fallopian tubes  Uterus weight 631grams  DISPOSITION OF SPECIMEN:  PATHOLOGY  COUNTS:  YES  TOURNIQUET:  * No tourniquets in log *  PLAN OF CARE: Extended recovery with same day discharge if meeting goals  PATIENT DISPOSITION:  PACU - hemodynamically stable.   Delay start of Pharmacological VTE agent (>24hrs) due to surgical blood loss or risk of bleeding: not applicable  Complications: None  Findings: Uterus with multiple fibroids, fallopian rings bilaterally, normal ovaries. Normal Appendix. On cystoscopy the bladder was intact and bilateral spill was seen from each ureteral orifcace.  Description of procedure:  After consent was verified the patient was taken to the operating room.  After adequate anesthesia was achieved the patient was positioned prepped and draped in the usual sterile fashion.  Exam revealed a 16 week size mobile uterus. A speculum was placed in the vagina and the uterus was sounded to 12cm and the cervix dilated with Kennon Rounds dilators.  The 3 cm KOH ring at the insula was assembled and placed in the proper fashion.  The speculum was removed and the bladder was drained with a Foley catheter.  Attention was turned to the abdomen where  a 1 cm incision was made above the umbilicus.  The robotic trochar was used for direct entry, and the  abdomen was insufflated.  The 8.32mm robotic trochars were placed 15 cm from the midline incision.  A 5 mm assistant port was placed 3 cm above the left robotic trocar. All trochars were inserted under direct visualization of the camera.  The patient was placed in Trendelenburg and the robot was docked.  The fenestrated bipolar was placed on arm 1 and the monopolar scissors on arm 3 and introduced under direct visualization of the camera.  Survey of the abdomen revealed a uterus multiple fibroids anterior and posterior and minimal adhesions. Prior tubal ligation via fallopian ring present bilaterally. Normal ovaries bilaterally. Ureters identified multiple points of the case and were out of the filed of dissection. Normal appendix and upper abdomen. I then broke scrub and sat on the console.   The left fallopian tube was isolated and cauterized with the bipolar.  The round ligament was divided with the bipolar cautery in shoes.  The utero-ovarian ligament was then divided with the bipolar cautery and shears.  The posterior broad ligament was then divided with the monopolar scissors to the level of the uterosacral ligament.  The anterior leaf of the broad ligament was developed pass the KOH ring for the bladder flap. The uterine artery was cuaterized  The right tube was then cauterized with bipolar and divided with shears followed by the round ligament and the utero-ovarian ligament both divided with the bipolar forceps and scissors.  The posterior leaf was the divided to the level of the utero sacral ligament. The anterior leaf was developed past the KOH ring for the bladder flap. The right uterine artery was cauterized.  The ureters were identified well out of the field of dissection. Both uterine arteries were then ligated.   The bladder was retracted inferiorly and the vesicouterine fascia was incised in the midline and the bladder was removed 1 cm below the KOH ring.  The monopolar scissors were then used  for the colpotomy and incised the vagina at the level of the KOH ring circumferentially.  After the uterus and cervix were amputated the cuff was inspected and noted to be hemostatic.  The uterus was removed through the vagina in multiple pieces using the scalpel at the level of the vagina due the bulky uterus. The fallopian tubes were removed through the vagina.  The scissors were exchanged for the Mega suture cut needle driver and the cuff was closed with 0 Vicryl V-Loc in a running fashion. The cuff and pedicles were hemostatic. Irrigation was performed. The ureters were peristalsing bilaterally and lateral to the pedicles. Pressure was reduced and hemostasis was noted again. Ropivicaine was introduced in the pelvis.  The robot was undocked and I scrubbed back in. The skin incisions were closed with 3-0 vicryl rapide in a subcuticular fashion. Dermabond was placed. All instruments were removed from the vagina and the vagina was intact on speculum exam and by palpation. Cystoscopy was performed revealing an intact bladder and bilateral spill from the ureters. The cystoscopy was removed. Foley replaced. The patient taken to the recovery room in a stable condition.   Tino Ronan K Taam-Akelman 01/06/20 10:20 AM

## 2020-01-07 ENCOUNTER — Encounter (HOSPITAL_BASED_OUTPATIENT_CLINIC_OR_DEPARTMENT_OTHER): Payer: Self-pay | Admitting: Obstetrics & Gynecology

## 2020-01-07 LAB — SURGICAL PATHOLOGY

## 2020-01-08 NOTE — Discharge Summary (Signed)
Physician Discharge Summary  Patient ID: Samantha Robinson MRN: [id number] DOB/AGE: 44-Nov-1977 44 y.o.  Admit date: 01/06/2020 Discharge date: 01/08/2020  Admission Diagnoses: Fibroids, menorrhagia   Discharge Diagnoses:  Active Problems:   Fibroid   S/P hysterectomy   Discharged Condition: good  Hospital Course:  The patient was admitted through pre-op holding where her consent was reviewed and all questions were answered. She was taken to the operating room by attending surgeon Dr. Shellia Cleverly. She underwent an uncomplicated XI ROBOTIC ASSISTED LAPAROSCOPIC HYSTERECTOMY AND SALPINGECTOMY (Bilateral) CYSTOSCOPY. Please see operative dictation for further details. Following her surgery she was taken to the PACU for recovery and then transferred to extended recovery. On the afternoon of POD#0 her foley was removed. She was tolerating a regular diet, her pain was controlled on po pain medications, she was ambulating without assistance and voiding spontaneously. By POD#0 afternoon, she was meeting all goals and deemed stable for discharge.  Consults: None  Significant Diagnostic Studies:  No results for input(s): WBC, HGB, HCT, PLT, NA, K, CL, BUN, CREATININE, AST, ALT, BILITOT in the last 72 hours. Treatments: surgery: XI ROBOTIC ASSISTED LAPAROSCOPIC HYSTERECTOMY AND SALPINGECTOMY (Bilateral) CYSTOSCOPY Discharge Exam: Blood pressure (!) 141/88, pulse 92, temperature 97.9 F (36.6 C), resp. rate 16, height 5\' 1"  (1.549 m), weight 100.8 kg, SpO2 97 %. Resting comfortably Abdomen soft, nondistended SCDs on  Disposition: Discharge disposition: 01-Home or Self Care        Discharge Instructions    Call MD for:  difficulty breathing, headache or visual disturbances   Complete by: As directed    Call MD for:  extreme fatigue   Complete by: As directed    Call MD for:  hives   Complete by: As directed    Call MD for:  persistant dizziness or light-headedness   Complete by: As  directed    Call MD for:  persistant nausea and vomiting   Complete by: As directed    Call MD for:  redness, tenderness, or signs of infection (pain, swelling, redness, odor or green/yellow discharge around incision site)   Complete by: As directed    Call MD for:  severe uncontrolled pain   Complete by: As directed    Call MD for:  temperature >100.4   Complete by: As directed    Diet general   Complete by: As directed    Driving Restrictions   Complete by: As directed    No driving if taking narcotic medications. Drive once able to slam on brakes without pain   Increase activity slowly   Complete by: As directed    Lifting restrictions   Complete by: As directed    No lifting >10lb for 8 weeks   Sexual Activity Restrictions   Complete by: As directed    Pelvic rest for 8 weeks     Allergies as of 01/06/2020      Reactions   Penicillins Nausea Only   Has patient had a PCN reaction causing immediate rash, facial/tongue/throat swelling, SOB or lightheadedness with hypotension: No Has patient had a PCN reaction causing severe rash involving mucus membranes or skin necrosis: No Has patient had a PCN reaction that required hospitalization: No Has patient had a PCN reaction occurring within the last 10 years: No If all of the above answers are "NO", then may proceed with Cephalosporin use.      Medication List    STOP taking these medications   meloxicam 7.5 MG tablet Commonly known as: MOBIC  norethindrone 0.35 MG tablet Commonly known as: MICRONOR   norethindrone 5 MG tablet Commonly known as: AYGESTIN     TAKE these medications   ALLERGY MEDICINE PO Take 1 tablet by mouth daily.   citalopram 20 MG tablet Commonly known as: CELEXA Take 20 mg by mouth daily.   docusate sodium 100 MG capsule Commonly known as: COLACE Take 100 mg by mouth daily.   ferrous sulfate 325 (65 FE) MG tablet Take 325 mg by mouth daily with breakfast.   fluticasone 50 MCG/ACT nasal  spray Commonly known as: FLONASE Place 2 sprays into both nostrils daily.   hydrochlorothiazide 25 MG tablet Commonly known as: HYDRODIURIL Take 25 mg by mouth daily.   ibuprofen 800 MG tablet Commonly known as: ADVIL Take 1 tablet (800 mg total) by mouth every 8 (eight) hours as needed.   levothyroxine 112 MCG tablet Commonly known as: SYNTHROID Take 112 mcg by mouth daily before breakfast.   loratadine 10 MG tablet Commonly known as: CLARITIN Take 10 mg by mouth daily.   omeprazole 20 MG capsule Commonly known as: PRILOSEC Take 20 mg by mouth daily.   oxyCODONE-acetaminophen 5-325 MG tablet Commonly known as: Percocet Take 1 tablet by mouth every 6 (six) hours as needed for severe pain.   simethicone 80 MG chewable tablet Commonly known as: Gas-X Chew 1 tablet (80 mg total) by mouth every 6 (six) hours as needed for flatulence.       Follow-up Information    Jonelle Sidle, MD On 01/21/2020.   Specialty: Obstetrics and Gynecology Contact information: [place] [address] [phone]               Signed: Jonelle Sidle 01/08/2020, 6:11 AM

## 2020-01-21 DIAGNOSIS — Z6841 Body Mass Index (BMI) 40.0 and over, adult: Secondary | ICD-10-CM | POA: Diagnosis not present

## 2020-01-21 DIAGNOSIS — R309 Painful micturition, unspecified: Secondary | ICD-10-CM | POA: Diagnosis not present

## 2020-01-21 DIAGNOSIS — B373 Candidiasis of vulva and vagina: Secondary | ICD-10-CM | POA: Diagnosis not present

## 2020-01-27 DIAGNOSIS — Z20828 Contact with and (suspected) exposure to other viral communicable diseases: Secondary | ICD-10-CM | POA: Diagnosis not present

## 2020-01-27 DIAGNOSIS — Z1159 Encounter for screening for other viral diseases: Secondary | ICD-10-CM | POA: Diagnosis not present

## 2020-02-05 DIAGNOSIS — Z20828 Contact with and (suspected) exposure to other viral communicable diseases: Secondary | ICD-10-CM | POA: Diagnosis not present

## 2020-02-05 DIAGNOSIS — Z1159 Encounter for screening for other viral diseases: Secondary | ICD-10-CM | POA: Diagnosis not present

## 2020-02-09 DIAGNOSIS — Z1159 Encounter for screening for other viral diseases: Secondary | ICD-10-CM | POA: Diagnosis not present

## 2020-02-09 DIAGNOSIS — Z20828 Contact with and (suspected) exposure to other viral communicable diseases: Secondary | ICD-10-CM | POA: Diagnosis not present

## 2020-02-19 DIAGNOSIS — Z1159 Encounter for screening for other viral diseases: Secondary | ICD-10-CM | POA: Diagnosis not present

## 2020-02-19 DIAGNOSIS — Z20828 Contact with and (suspected) exposure to other viral communicable diseases: Secondary | ICD-10-CM | POA: Diagnosis not present

## 2020-02-23 DIAGNOSIS — Z20828 Contact with and (suspected) exposure to other viral communicable diseases: Secondary | ICD-10-CM | POA: Diagnosis not present

## 2020-02-23 DIAGNOSIS — Z1159 Encounter for screening for other viral diseases: Secondary | ICD-10-CM | POA: Diagnosis not present

## 2020-03-04 DIAGNOSIS — Z20828 Contact with and (suspected) exposure to other viral communicable diseases: Secondary | ICD-10-CM | POA: Diagnosis not present

## 2020-03-04 DIAGNOSIS — Z1159 Encounter for screening for other viral diseases: Secondary | ICD-10-CM | POA: Diagnosis not present

## 2020-03-08 DIAGNOSIS — Z1159 Encounter for screening for other viral diseases: Secondary | ICD-10-CM | POA: Diagnosis not present

## 2020-03-08 DIAGNOSIS — Z20828 Contact with and (suspected) exposure to other viral communicable diseases: Secondary | ICD-10-CM | POA: Diagnosis not present

## 2020-03-15 DIAGNOSIS — Z20828 Contact with and (suspected) exposure to other viral communicable diseases: Secondary | ICD-10-CM | POA: Diagnosis not present

## 2020-03-15 DIAGNOSIS — Z1159 Encounter for screening for other viral diseases: Secondary | ICD-10-CM | POA: Diagnosis not present

## 2020-03-18 DIAGNOSIS — Z20828 Contact with and (suspected) exposure to other viral communicable diseases: Secondary | ICD-10-CM | POA: Diagnosis not present

## 2020-03-18 DIAGNOSIS — Z1159 Encounter for screening for other viral diseases: Secondary | ICD-10-CM | POA: Diagnosis not present

## 2020-03-22 DIAGNOSIS — Z20828 Contact with and (suspected) exposure to other viral communicable diseases: Secondary | ICD-10-CM | POA: Diagnosis not present

## 2020-03-22 DIAGNOSIS — Z1159 Encounter for screening for other viral diseases: Secondary | ICD-10-CM | POA: Diagnosis not present

## 2020-04-01 DIAGNOSIS — Z20828 Contact with and (suspected) exposure to other viral communicable diseases: Secondary | ICD-10-CM | POA: Diagnosis not present

## 2020-04-01 DIAGNOSIS — Z1159 Encounter for screening for other viral diseases: Secondary | ICD-10-CM | POA: Diagnosis not present

## 2020-04-12 DIAGNOSIS — Z20828 Contact with and (suspected) exposure to other viral communicable diseases: Secondary | ICD-10-CM | POA: Diagnosis not present

## 2020-04-12 DIAGNOSIS — Z1159 Encounter for screening for other viral diseases: Secondary | ICD-10-CM | POA: Diagnosis not present

## 2020-04-19 DIAGNOSIS — Z1159 Encounter for screening for other viral diseases: Secondary | ICD-10-CM | POA: Diagnosis not present

## 2020-04-19 DIAGNOSIS — Z20828 Contact with and (suspected) exposure to other viral communicable diseases: Secondary | ICD-10-CM | POA: Diagnosis not present

## 2020-04-26 DIAGNOSIS — Z20828 Contact with and (suspected) exposure to other viral communicable diseases: Secondary | ICD-10-CM | POA: Diagnosis not present

## 2020-04-26 DIAGNOSIS — Z1159 Encounter for screening for other viral diseases: Secondary | ICD-10-CM | POA: Diagnosis not present

## 2020-04-29 DIAGNOSIS — Z1159 Encounter for screening for other viral diseases: Secondary | ICD-10-CM | POA: Diagnosis not present

## 2020-04-29 DIAGNOSIS — Z20828 Contact with and (suspected) exposure to other viral communicable diseases: Secondary | ICD-10-CM | POA: Diagnosis not present

## 2020-05-03 DIAGNOSIS — Z1159 Encounter for screening for other viral diseases: Secondary | ICD-10-CM | POA: Diagnosis not present

## 2020-05-03 DIAGNOSIS — Z20828 Contact with and (suspected) exposure to other viral communicable diseases: Secondary | ICD-10-CM | POA: Diagnosis not present

## 2020-05-13 DIAGNOSIS — Z1159 Encounter for screening for other viral diseases: Secondary | ICD-10-CM | POA: Diagnosis not present

## 2020-05-13 DIAGNOSIS — Z20828 Contact with and (suspected) exposure to other viral communicable diseases: Secondary | ICD-10-CM | POA: Diagnosis not present

## 2020-05-27 DIAGNOSIS — J069 Acute upper respiratory infection, unspecified: Secondary | ICD-10-CM | POA: Diagnosis not present

## 2020-06-06 IMAGING — DX DG ABDOMEN 1V
1 series · 1 of 1 positions shown · non-contrast
Comparison: None.

CLINICAL DATA: Encounter for routine checking of intrauterine
contraceptive device. Evaluate for IUD.

EXAM:
ABDOMEN - 1 VIEW

[dg abd 1 view]
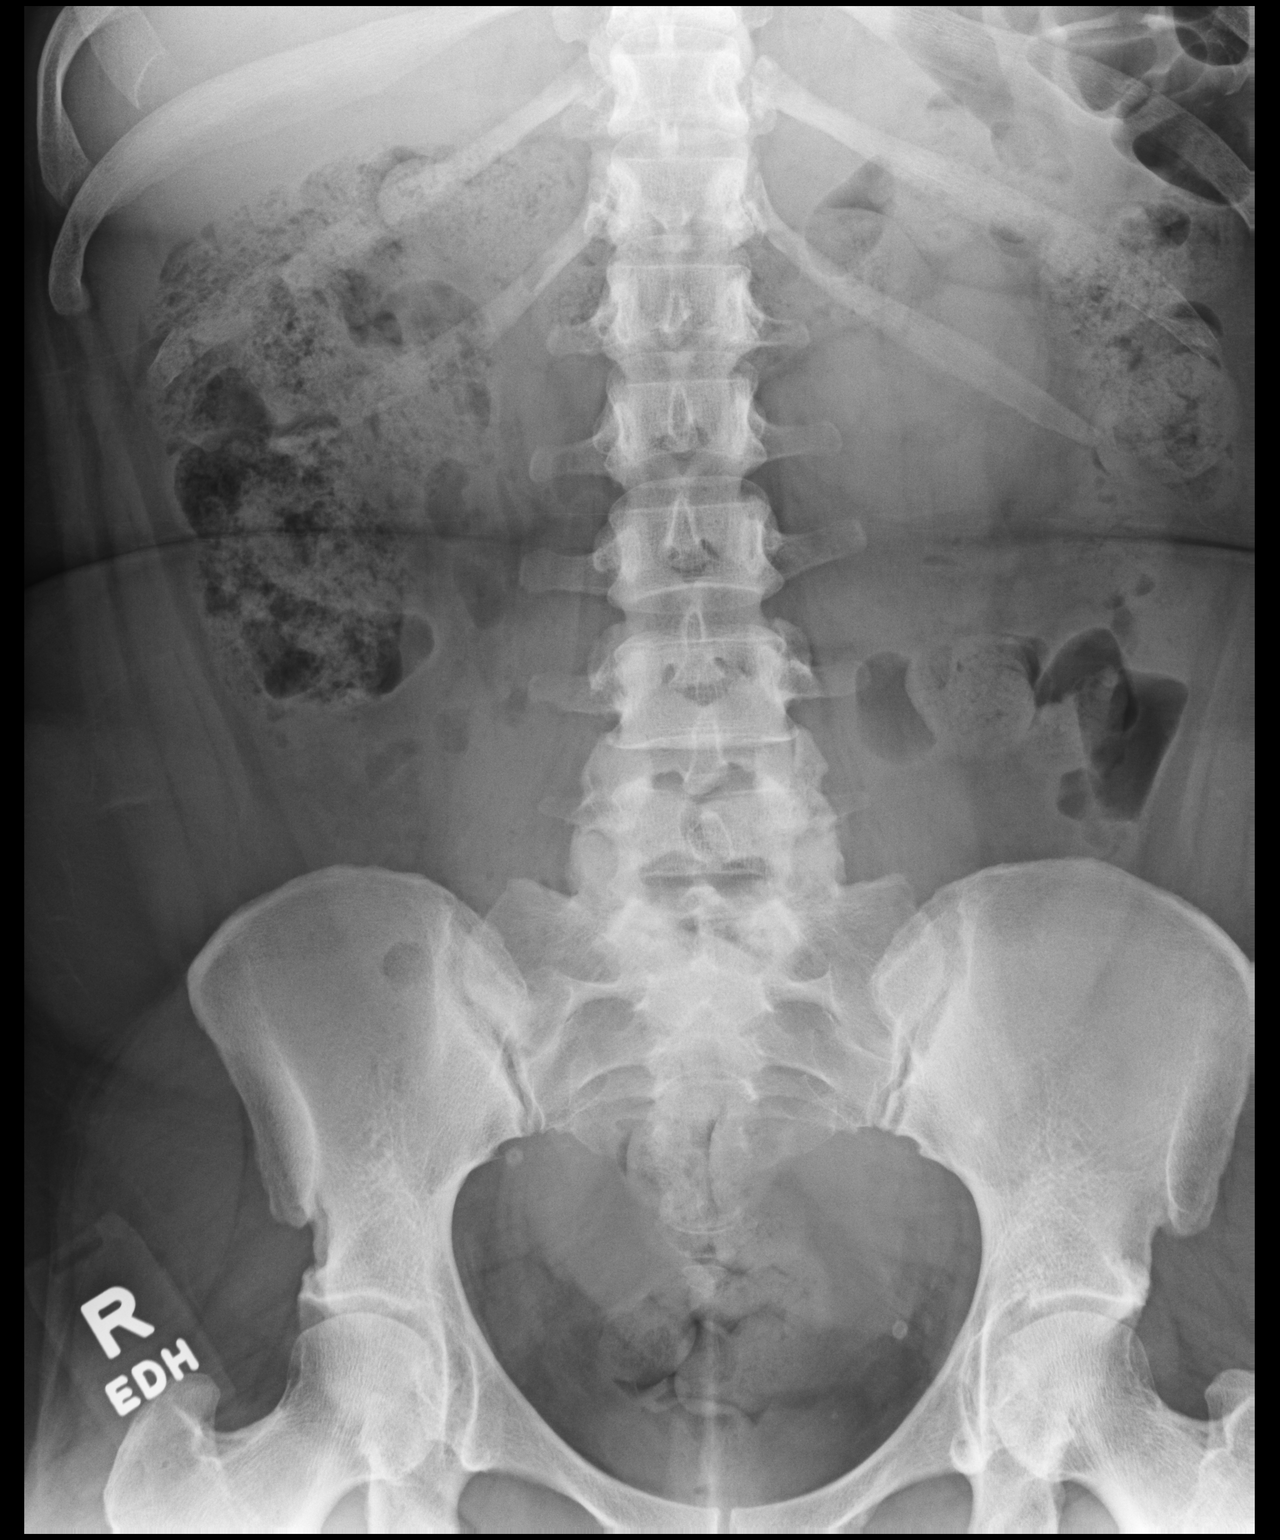

[1 of 1 positions shown; findings below may reference images not displayed]

FINDINGS: No intrauterine device is seen within the abdomen or pelvis. Normal
bowel gas pattern. Moderate stool in the proximal colon. No evidence
of free air. No osseous abnormalities are seen. No radiopaque
calculi, there are pelvic phleboliths.
IMPRESSION: No intrauterine device is seen within the abdomen or pelvis.

## 2020-07-26 DIAGNOSIS — J3089 Other allergic rhinitis: Secondary | ICD-10-CM | POA: Diagnosis not present

## 2020-09-30 DIAGNOSIS — Z20822 Contact with and (suspected) exposure to covid-19: Secondary | ICD-10-CM | POA: Diagnosis not present

## 2020-10-01 DIAGNOSIS — Z20822 Contact with and (suspected) exposure to covid-19: Secondary | ICD-10-CM | POA: Diagnosis not present

## 2021-01-05 DIAGNOSIS — Z0000 Encounter for general adult medical examination without abnormal findings: Secondary | ICD-10-CM | POA: Diagnosis not present

## 2021-01-05 DIAGNOSIS — D649 Anemia, unspecified: Secondary | ICD-10-CM | POA: Diagnosis not present

## 2021-01-05 DIAGNOSIS — Z13220 Encounter for screening for lipoid disorders: Secondary | ICD-10-CM | POA: Diagnosis not present

## 2021-01-05 DIAGNOSIS — F330 Major depressive disorder, recurrent, mild: Secondary | ICD-10-CM | POA: Diagnosis not present

## 2021-01-05 DIAGNOSIS — E039 Hypothyroidism, unspecified: Secondary | ICD-10-CM | POA: Diagnosis not present

## 2021-01-05 DIAGNOSIS — I10 Essential (primary) hypertension: Secondary | ICD-10-CM | POA: Diagnosis not present

## 2021-01-05 DIAGNOSIS — R799 Abnormal finding of blood chemistry, unspecified: Secondary | ICD-10-CM | POA: Diagnosis not present

## 2021-03-28 DIAGNOSIS — J069 Acute upper respiratory infection, unspecified: Secondary | ICD-10-CM | POA: Diagnosis not present

## 2021-03-29 DIAGNOSIS — J069 Acute upper respiratory infection, unspecified: Secondary | ICD-10-CM | POA: Diagnosis not present

## 2021-03-29 DIAGNOSIS — Z03818 Encounter for observation for suspected exposure to other biological agents ruled out: Secondary | ICD-10-CM | POA: Diagnosis not present

## 2021-03-30 DIAGNOSIS — J40 Bronchitis, not specified as acute or chronic: Secondary | ICD-10-CM | POA: Diagnosis not present

## 2021-03-30 DIAGNOSIS — J069 Acute upper respiratory infection, unspecified: Secondary | ICD-10-CM | POA: Diagnosis not present

## 2021-05-04 DIAGNOSIS — R058 Other specified cough: Secondary | ICD-10-CM | POA: Diagnosis not present

## 2021-06-14 ENCOUNTER — Ambulatory Visit (HOSPITAL_COMMUNITY)
Admission: EM | Admit: 2021-06-14 | Discharge: 2021-06-14 | Disposition: A | Discharge status: Home / Self Care | Payer: BC Managed Care – PPO | Attending: Family Medicine | Admitting: Family Medicine

## 2021-06-14 ENCOUNTER — Other Ambulatory Visit: Payer: Self-pay

## 2021-06-14 ENCOUNTER — Encounter (HOSPITAL_COMMUNITY): Payer: Self-pay | Admitting: *Deleted

## 2021-06-14 DIAGNOSIS — R350 Frequency of micturition: Secondary | ICD-10-CM | POA: Insufficient documentation

## 2021-06-14 DIAGNOSIS — N390 Urinary tract infection, site not specified: Secondary | ICD-10-CM

## 2021-06-14 DIAGNOSIS — R300 Dysuria: Secondary | ICD-10-CM | POA: Diagnosis not present

## 2021-06-14 LAB — POCT URINALYSIS DIPSTICK, ED / UC
Bilirubin Urine: NEGATIVE
Glucose, UA: NEGATIVE mg/dL
Ketones, ur: NEGATIVE mg/dL
Nitrite: NEGATIVE
Protein, ur: 30 mg/dL — AB
Specific Gravity, Urine: <=1.005 (ref 1.005–1.030)
Urobilinogen, UA: 0.2 mg/dL (ref 0.0–1.0)
pH: 6 (ref 5.0–8.0)

## 2021-06-14 MED ORDER — PHENAZOPYRIDINE HCL 200 MG PO TABS: 200.0000 mg | ORAL_TABLET | Freq: Three times a day (TID) | ORAL | 0 refills | Status: DC

## 2021-06-14 MED ORDER — NITROFURANTOIN MONOHYD MACRO 100 MG PO CAPS: 100.0000 mg | ORAL_CAPSULE | Freq: Two times a day (BID) | ORAL | 0 refills | Status: AC

## 2021-06-14 NOTE — ED Triage Notes (Signed)
Pt reports dysuria started yesterday.

## 2021-06-14 NOTE — ED Provider Notes (Signed)
[place]    CSN: [id number] Arrival date & time: 06/14/21  1934      History   Chief Complaint Chief Complaint  Patient presents with   Dysuria    HPI Samantha Robinson is a 46 y.o. female.   Patient presents today with a 1 day history of UTI symptoms.  Reports dysuria, lower abdominal pain, urinary frequency, urinary urgency.  She denies any pelvic pain, fever, nausea, vomiting.  She has not tried any over-the-counter medication for symptom management.  She denies history of urinary tract infection.  She denies history of nephrolithiasis, single kidney, self-catheterization, recent urogenital procedure.  She is confident she is not pregnant as she is status post hysterectomy.  She denies any recent antibiotics.  She is having difficulty with daily activities as result of symptoms.   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    Patient Active Problem List   Diagnosis Date Noted   Fibroid 01/06/2020   S/P hysterectomy 01/06/2020    Past Surgical History:  Procedure Laterality Date   CYSTOSCOPY N/A 01/06/2020   Procedure: CYSTOSCOPY;  Surgeon: Jonelle Sidle, MD;  Location: [facility];  Service: Gynecology;  Laterality: N/A;   ROBOTIC ASSISTED LAPAROSCOPIC HYSTERECTOMY AND SALPINGECTOMY Bilateral 01/06/2020   Procedure: XI ROBOTIC ASSISTED LAPAROSCOPIC HYSTERECTOMY AND SALPINGECTOMY;  Surgeon: Jonelle Sidle, MD;  Location: [facility];  Service: Gynecology;  Laterality: Bilateral;   TUBAL LIGATION      OB History   No obstetric history on file.      Home Medications    Prior to Admission medications   Medication Sig Start Date End Date Taking? Authorizing Provider  nitrofurantoin,  macrocrystal-monohydrate, (MACROBID) 100 MG capsule Take 1 capsule (100 mg total) by mouth 2 (two) times daily for 7 days. 06/14/21 06/21/21 Yes Dae Highley, Derry Skill, PA-C  phenazopyridine (PYRIDIUM) 200 MG tablet Take 1 tablet (200 mg total) by mouth 3 (three) times daily. 06/14/21  Yes Latarra Eagleton K, PA-C  citalopram (CELEXA) 20 MG tablet Take 20 mg by mouth daily.  07/17/17   [provider]  docusate sodium (COLACE) 100 MG capsule Take 100 mg by mouth daily.    [provider]  ferrous sulfate 325 (65 FE) MG tablet Take 325 mg by mouth daily with breakfast.    [provider]  fluticasone (FLONASE) 50 MCG/ACT nasal spray Place 2 sprays into both nostrils daily.    [provider]  Homeopathic Products (ALLERGY MEDICINE PO) Take 1 tablet by mouth daily. Patient not taking: Reported on 12/22/2019    [provider]  hydrochlorothiazide (HYDRODIURIL) 25 MG tablet Take 25 mg by mouth daily. 07/22/17   [provider]  ibuprofen (ADVIL) 800 MG tablet Take 1 tablet (800 mg total) by mouth every 8 (eight) hours as needed. 01/06/20   Jonelle Sidle, MD  levothyroxine (SYNTHROID) 112 MCG tablet Take 112 mcg by mouth daily before breakfast.  05/20/17   [provider]  loratadine (CLARITIN) 10 MG tablet Take 10 mg by mouth daily.    [provider]  omeprazole (PRILOSEC) 20 MG capsule Take 20 mg by mouth daily. 06/17/17   [provider]  oxyCODONE-acetaminophen (PERCOCET) 5-325 MG tablet Take 1 tablet by mouth every 6 (six) hours as needed for severe pain. 01/06/20   Jonelle Sidle, MD  simethicone (GAS-X) 80 MG chewable tablet Chew 1 tablet (80 mg total) by mouth every 6 (six) hours as needed for flatulence. 01/06/20   Jonelle Sidle, MD    Family History History reviewed. No pertinent family history.  Social History Social History   Tobacco Use   Smoking status: Never   Smokeless tobacco: Never  Vaping Use    Vaping Use: Never used  Substance Use Topics   Alcohol use: Yes    Comment: occasional   Drug use: Never     Allergies   Penicillins   Review of Systems Review of Systems  Constitutional:  Positive for activity change. Negative for appetite change, fatigue and fever.  Respiratory:  Negative for cough and shortness of breath.   Cardiovascular:  Negative for chest pain.  Gastrointestinal:  Positive for abdominal pain (lower). Negative for diarrhea, nausea and vomiting.  Genitourinary:  Positive for dysuria, frequency and urgency. Negative for flank pain, pelvic pain, vaginal bleeding, vaginal discharge and vaginal pain.  Neurological:  Negative for dizziness, light-headedness and headaches.    Physical Exam Triage Vital Signs ED Triage Vitals  Enc Vitals Group     BP 06/14/21 1956 (!) 153/80     Pulse Rate 06/14/21 1956 89     Resp 06/14/21 1956 18     Temp 06/14/21 1956 98.7 F (37.1 C)     Temp src --      SpO2 06/14/21 1956 99 %     Weight --      Height --      Head Circumference --      Peak Flow --      Pain Score 06/14/21 1954 10     Pain Loc --      Pain Edu? --      Excl. in [place]? --    No data found.  Updated Vital Signs BP (!) 153/80    Pulse 89    Temp 98.7 F (37.1 C)    Resp 18    SpO2 99%   Visual Acuity Right Eye Distance:   Left Eye Distance:   Bilateral Distance:    Right Eye Near:   Left Eye Near:    Bilateral Near:     Physical Exam Vitals reviewed.  Constitutional:      General: She is awake. She is not in acute distress.    Appearance: Normal appearance. She is well-developed. She is not ill-appearing.     Comments: Very pleasant female appears at age in no acute distress sitting comfortably in exam room  HENT:     Head: Normocephalic and atraumatic.  Cardiovascular:     Rate and Rhythm: Normal rate and regular rhythm.     Heart sounds: Normal heart sounds, S1 normal and S2 normal. No murmur heard. Pulmonary:     Effort: Pulmonary  effort is normal.     Breath sounds: Normal breath sounds. No wheezing, rhonchi or rales.     Comments: Clear to auscultation bilaterally Abdominal:     General: Bowel sounds are normal.     Palpations: Abdomen is soft.     Tenderness: There is no abdominal tenderness. There is no right CVA tenderness, left CVA tenderness, guarding or rebound.     Comments: Benign abdominal exam  Genitourinary:    Comments: Exam deferred Psychiatric:  Behavior: Behavior is cooperative.     UC Treatments / Results  Labs (all labs ordered are listed, but only abnormal results are displayed) Labs Reviewed  POCT URINALYSIS DIPSTICK, ED / UC - Abnormal; Notable for the following components:      Result Value   Hgb urine dipstick LARGE (*)    Protein, ur 30 (*)    Leukocytes,Ua SMALL (*)    All other components within normal limits  URINE CULTURE    EKG   Radiology No results found.  Procedures Procedures (including critical care time)  Medications Ordered in UC Medications - No data to display  Initial Impression / Assessment and Plan / UC Course  I have reviewed the triage vital signs and the nursing notes.  Pertinent labs & imaging results that were available during my care of the patient were reviewed by me and considered in my medical decision making (see chart for details).     UA showed hemoglobin and leukocyte esterase.  Given abnormal findings on UA and clinical presentation will treat for UTI.  Patient was started on Macrobid twice daily for 7 days given penicillin allergy.  Urine culture obtained today-results pending.  Discussed potential need to change antibiotics based on susceptibilities identified on culture.  She was provided Pyridium for pain relief.  Recommend she rest and drink plenty of fluid.  She was provided work excuse note for several days until symptoms improve.  Discussed that if she has any worsening symptoms including high fever, abdominal pain,  nausea/vomiting, pelvic pain, vaginal symptoms she needs to be seen immediately.  Recommended follow-up with either our clinic or PCP within 1 to 2 weeks to ensure clearing of hematuria noted on UA today.  Strict return precautions given to which she expressed understanding.  Final Clinical Impressions(s) / UC Diagnoses   Final diagnoses:  Lower urinary tract infectious disease  Dysuria  Urinary frequency     Discharge Instructions      We are treating you for urinary tract infection.  Start Macrobid twice daily for 7 days.  Use Pyridium up to 3 times a day as needed for pain.  Make sure you are drinking plenty of fluid.  We are going to send her urine off for culture and we will contact you if we need to change your antibiotics.  I do recommend a follow-up with either our clinic or your primary care in a couple of weeks to ensure that your urine goes back to normal after we treat the infection.  If anything worsens and you develop fever, nausea, vomiting, pelvic pain, abdominal pain, blood in your urine, pain in your back, weakness you need to be seen immediately.     ED Prescriptions     Medication Sig Dispense Auth. Provider   nitrofurantoin, macrocrystal-monohydrate, (MACROBID) 100 MG capsule Take 1 capsule (100 mg total) by mouth 2 (two) times daily for 7 days. 14 capsule Taten Merrow K, PA-C   phenazopyridine (PYRIDIUM) 200 MG tablet Take 1 tablet (200 mg total) by mouth 3 (three) times daily. 6 tablet Tomy Khim, Derry Skill, PA-C      PDMP not reviewed this encounter.   Terrilee Croak, PA-C 06/14/21 2011

## 2021-06-14 NOTE — Discharge Instructions (Addendum)
We are treating you for urinary tract infection.  Start Macrobid twice daily for 7 days.  Use Pyridium up to 3 times a day as needed for pain.  Make sure you are drinking plenty of fluid.  We are going to send her urine off for culture and we will contact you if we need to change your antibiotics.  I do recommend a follow-up with either our clinic or your primary care in a couple of weeks to ensure that your urine goes back to normal after we treat the infection.  If anything worsens and you develop fever, nausea, vomiting, pelvic pain, abdominal pain, blood in your urine, pain in your back, weakness you need to be seen immediately.

## 2021-06-17 LAB — URINE CULTURE: Culture: 80000 — AB

## 2021-09-22 DIAGNOSIS — N898 Other specified noninflammatory disorders of vagina: Secondary | ICD-10-CM | POA: Diagnosis not present

## 2022-01-10 DIAGNOSIS — Z0000 Encounter for general adult medical examination without abnormal findings: Secondary | ICD-10-CM | POA: Diagnosis not present

## 2022-01-10 DIAGNOSIS — I10 Essential (primary) hypertension: Secondary | ICD-10-CM | POA: Diagnosis not present

## 2022-01-10 DIAGNOSIS — E039 Hypothyroidism, unspecified: Secondary | ICD-10-CM | POA: Diagnosis not present

## 2022-01-10 DIAGNOSIS — Z13220 Encounter for screening for lipoid disorders: Secondary | ICD-10-CM | POA: Diagnosis not present

## 2022-01-10 DIAGNOSIS — F330 Major depressive disorder, recurrent, mild: Secondary | ICD-10-CM | POA: Diagnosis not present

## 2022-01-10 DIAGNOSIS — K219 Gastro-esophageal reflux disease without esophagitis: Secondary | ICD-10-CM | POA: Diagnosis not present

## 2022-02-27 DIAGNOSIS — M542 Cervicalgia: Secondary | ICD-10-CM | POA: Diagnosis not present

## 2022-02-27 DIAGNOSIS — R21 Rash and other nonspecific skin eruption: Secondary | ICD-10-CM | POA: Diagnosis not present

## 2022-02-27 DIAGNOSIS — G729 Myopathy, unspecified: Secondary | ICD-10-CM | POA: Diagnosis not present

## 2022-03-12 DIAGNOSIS — Z6841 Body Mass Index (BMI) 40.0 and over, adult: Secondary | ICD-10-CM | POA: Diagnosis not present

## 2022-03-12 DIAGNOSIS — Z113 Encounter for screening for infections with a predominantly sexual mode of transmission: Secondary | ICD-10-CM | POA: Diagnosis not present

## 2022-03-12 DIAGNOSIS — Z1231 Encounter for screening mammogram for malignant neoplasm of breast: Secondary | ICD-10-CM | POA: Diagnosis not present

## 2022-03-12 DIAGNOSIS — Z01419 Encounter for gynecological examination (general) (routine) without abnormal findings: Secondary | ICD-10-CM | POA: Diagnosis not present

## 2022-03-13 DIAGNOSIS — D509 Iron deficiency anemia, unspecified: Secondary | ICD-10-CM | POA: Diagnosis not present

## 2022-03-13 DIAGNOSIS — E8889 Other specified metabolic disorders: Secondary | ICD-10-CM | POA: Diagnosis not present

## 2022-03-13 DIAGNOSIS — I10 Essential (primary) hypertension: Secondary | ICD-10-CM | POA: Diagnosis not present

## 2022-03-13 DIAGNOSIS — Z1331 Encounter for screening for depression: Secondary | ICD-10-CM | POA: Diagnosis not present

## 2022-03-13 DIAGNOSIS — E039 Hypothyroidism, unspecified: Secondary | ICD-10-CM | POA: Diagnosis not present

## 2022-03-27 DIAGNOSIS — I10 Essential (primary) hypertension: Secondary | ICD-10-CM | POA: Diagnosis not present

## 2022-03-27 DIAGNOSIS — R948 Abnormal results of function studies of other organs and systems: Secondary | ICD-10-CM | POA: Diagnosis not present

## 2022-04-08 ENCOUNTER — Ambulatory Visit (HOSPITAL_COMMUNITY)
Admission: EM | Admit: 2022-04-08 | Discharge: 2022-04-08 | Disposition: A | Discharge status: Home / Self Care | Payer: BC Managed Care – PPO

## 2022-04-08 ENCOUNTER — Encounter (HOSPITAL_COMMUNITY): Payer: Self-pay

## 2022-04-08 DIAGNOSIS — J069 Acute upper respiratory infection, unspecified: Secondary | ICD-10-CM | POA: Diagnosis not present

## 2022-04-08 DIAGNOSIS — J3489 Other specified disorders of nose and nasal sinuses: Secondary | ICD-10-CM

## 2022-04-08 MED ORDER — BENZONATATE 100 MG PO CAPS: 100.0000 mg | ORAL_CAPSULE | Freq: Three times a day (TID) | ORAL | 0 refills | Status: DC

## 2022-04-08 NOTE — Discharge Instructions (Addendum)
Continue taking Claritin and Flonase. You may stop taking Claritin and start taking Zyrtec if you find that Claritin is no longer working to help dry up your nasal drainage.  You may take Tessalon Perles every 8 hours as needed for cough.  Continue use of your albuterol inhaler as needed for cough, shortness of breath, and wheeze.  If you develop any new or worsening symptoms or do not improve in the next 2 to 3 days, please return.  If your symptoms are severe, please go to the emergency room.  Follow-up with your primary care provider for further evaluation and management of your symptoms as well as ongoing wellness visits.  I hope you feel better!

## 2022-04-08 NOTE — ED Provider Notes (Signed)
[facility]    CSN: [id number] Arrival date & time: 04/08/22  1611      History   Chief Complaint Chief Complaint  Patient presents with   Cough   Nasal Congestion    HPI Samantha Robinson is a 46 y.o. female.   Patient presents urgent care for evaluation of dry cough, rhinorrhea, and generalized fatigue that started approximately 7 days ago.  Denies sore throat, headache, ear pain, body aches, chest pain, weakness, dizziness, abdominal pain, nausea, vomiting, diarrhea, and constipation.  Cough is dry and nasal drainage was clear initially but is now yellow/green.  Denies sinus pressure, vision changes, and difficulty swallowing.  She has history of allergic rhinitis and states that she gets a viral upper respiratory infection "every year that usually turns into bronchitis".  She has been using her albuterol inhaler from last year to help with cough and shortness of breath intermittently with some relief.  She denies wheeze, history of asthma, or history of other chronic respiratory problems.  She is not a smoker and denies drug use.  No known sick contacts.  She is attempted use of guaifenesin, Alka-Seltzer plus, and DayQuil prior to arrival urgent care.  She takes Claritin and Flonase daily for seasonal allergic rhinitis.   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    Patient Active Problem List   Diagnosis Date Noted   Fibroid 01/06/2020   S/P hysterectomy 01/06/2020    Past Surgical History:  Procedure Laterality Date   CYSTOSCOPY N/A 01/06/2020   Procedure: CYSTOSCOPY;  Surgeon: Jonelle Sidle, MD;  Location: [facility];  Service: Gynecology;  Laterality: N/A;   ROBOTIC ASSISTED LAPAROSCOPIC HYSTERECTOMY AND  SALPINGECTOMY Bilateral 01/06/2020   Procedure: XI ROBOTIC ASSISTED LAPAROSCOPIC HYSTERECTOMY AND SALPINGECTOMY;  Surgeon: Jonelle Sidle, MD;  Location: [facility];  Service: Gynecology;  Laterality: Bilateral;   TUBAL LIGATION      OB History   No obstetric history on file.      Home Medications    Prior to Admission medications   Medication Sig Start Date End Date Taking? Authorizing Provider  benzonatate (TESSALON) 100 MG capsule Take 1 capsule (100 mg total) by mouth every 8 (eight) hours. 04/08/22  Yes Candee Hoon, Stasia Cavalier, FNP  LOMAIRA 8 MG TABS Take 1 tablet by mouth every morning. 03/14/22  Yes [provider]  topiramate (TOPAMAX) 25 MG tablet Take 25 mg by mouth daily. 03/14/22  Yes [provider]  citalopram (CELEXA) 20 MG tablet Take 20 mg by mouth daily.  07/17/17   [provider]  docusate sodium (COLACE) 100 MG capsule Take 100 mg by mouth daily.    [provider]  ferrous sulfate 325 (65 FE) MG tablet Take 325 mg by mouth daily with breakfast.    [provider]  fluticasone (FLONASE) 50 MCG/ACT nasal spray Place 2 sprays into both nostrils daily.    [provider]  Homeopathic Products (ALLERGY MEDICINE PO) Take 1 tablet by mouth daily.    [provider]  hydrochlorothiazide (HYDRODIURIL) 25 MG tablet Take 25 mg by mouth daily. 07/22/17   [provider]  ibuprofen (ADVIL)  800 MG tablet Take 1 tablet (800 mg total) by mouth every 8 (eight) hours as needed. 01/06/20   Jonelle Sidle, MD  levothyroxine (SYNTHROID) 112 MCG tablet Take 112 mcg by mouth daily before breakfast.  05/20/17   [provider]  loratadine (CLARITIN) 10 MG tablet Take 10 mg by mouth daily.    [provider]  omeprazole (PRILOSEC) 20 MG capsule Take 20 mg by mouth daily. 06/17/17   [provider]  oxyCODONE-acetaminophen (PERCOCET) 5-325 MG tablet Take 1 tablet by mouth  every 6 (six) hours as needed for severe pain. 01/06/20   Taam-Akelman, Lawrence Santiago, MD  phenazopyridine (PYRIDIUM) 200 MG tablet Take 1 tablet (200 mg total) by mouth 3 (three) times daily. 06/14/21   Raspet, Derry Skill, PA-C  simethicone (GAS-X) 80 MG chewable tablet Chew 1 tablet (80 mg total) by mouth every 6 (six) hours as needed for flatulence. 01/06/20   Jonelle Sidle, MD    Family History History reviewed. No pertinent family history.  Social History Social History   Tobacco Use   Smoking status: Never   Smokeless tobacco: Never  Vaping Use   Vaping Use: Never used  Substance Use Topics   Alcohol use: Yes    Comment: occasional   Drug use: Never     Allergies   Penicillins   Review of Systems Review of Systems  Respiratory:  Positive for cough.   Per HPI   Physical Exam Triage Vital Signs ED Triage Vitals  Enc Vitals Group     BP 04/08/22 1650 123/74     Pulse Rate 04/08/22 1650 92     Resp 04/08/22 1650 16     Temp 04/08/22 1650 98.3 F (36.8 C)     Temp Source 04/08/22 1650 Oral     SpO2 04/08/22 1650 97 %     Weight --      Height --      Head Circumference --      Peak Flow --      Pain Score 04/08/22 1648 5     Pain Loc --      Pain Edu? --      Excl. in [place]? --    No data found.  Updated Vital Signs BP 123/74 (BP Location: Left Arm)   Pulse 92   Temp 98.3 F (36.8 C) (Oral)   Resp 16   LMP 09/23/2019 (Approximate)   SpO2 97%   Visual Acuity Right Eye Distance:   Left Eye Distance:   Bilateral Distance:    Right Eye Near:   Left Eye Near:    Bilateral Near:     Physical Exam Vitals and nursing note reviewed.  Constitutional:      Appearance: Normal appearance. She is not ill-appearing or toxic-appearing.  HENT:     Head: Normocephalic and atraumatic.     Right Ear: Hearing, tympanic membrane, ear canal and external ear normal.     Left Ear: Hearing, tympanic membrane, ear canal and external ear normal.     Nose: Rhinorrhea  present.     Mouth/Throat:     Lips: Pink.     Mouth: Mucous membranes are moist.     Pharynx: No posterior oropharyngeal erythema.     Comments: Small amount of clear postnasal drainage visualized to the posterior oropharynx.  Eyes:     General: Lids are normal. Vision grossly intact. Gaze aligned appropriately.        Right eye: No discharge.  Left eye: No discharge.     Extraocular Movements: Extraocular movements intact.     Conjunctiva/sclera: Conjunctivae normal.     Pupils: Pupils are equal, round, and reactive to light.  Cardiovascular:     Rate and Rhythm: Normal rate and regular rhythm.     Heart sounds: Normal heart sounds, S1 normal and S2 normal.  Pulmonary:     Effort: Pulmonary effort is normal. No respiratory distress.     Breath sounds: Normal breath sounds and air entry. No wheezing, rhonchi or rales.  Musculoskeletal:     Cervical back: Neck supple.  Lymphadenopathy:     Cervical: No cervical adenopathy.  Skin:    General: Skin is warm and dry.     Capillary Refill: Capillary refill takes less than 2 seconds.     Findings: No rash.  Neurological:     General: No focal deficit present.     Mental Status: She is alert and oriented to person, place, and time. Mental status is at baseline.     Cranial Nerves: No dysarthria or facial asymmetry.  Psychiatric:        Mood and Affect: Mood normal.        Speech: Speech normal.        Behavior: Behavior normal.        Thought Content: Thought content normal.        Judgment: Judgment normal.      UC Treatments / Results  Labs (all labs ordered are listed, but only abnormal results are displayed) Labs Reviewed - No data to display  EKG   Radiology No results found.  Procedures Procedures (including critical care time)  Medications Ordered in UC Medications - No data to display  Initial Impression / Assessment and Plan / UC Course  I have reviewed the triage vital signs and the nursing  notes.  Pertinent labs & imaging results that were available during my care of the patient were reviewed by me and considered in my medical decision making (see chart for details).   1. Viral URI with cough, rhinorrhea Presentation is consistent with viral URI that will likely improve with rest and conservative treatment with prescriptions for symptomatic relief.  Deferred viral testing as this will not change plan of care. Deferred imaging based on stable cardiopulmonary exam and hemodynamically stable vital signs. Patient may continue taking antihistamine and using flonase, but may switch to zyrtec instead of claritin if she feels as though claritin is not giving her symptomatic relief. May use tessalon pearles every 8 hours as needed for cough. Albuterol inhaler may be used as needed for cough, shortness of breath, and wheeze. She is agreeable with plan.  Discussed physical exam and available lab work findings in clinic with patient.  Counseled patient regarding appropriate use of medications and potential side effects for all medications recommended or prescribed today. Discussed red flag signs and symptoms of worsening condition,when to call the PCP office, return to urgent care, and when to seek higher level of care in the emergency department. Patient verbalizes understanding and agreement with plan. All questions answered. Patient discharged in stable condition.     Final Clinical Impressions(s) / UC Diagnoses   Final diagnoses:  Viral URI with cough  Rhinorrhea     Discharge Instructions      Continue taking Claritin and Flonase. You may stop taking Claritin and start taking Zyrtec if you find that Claritin is no longer working to help dry up your nasal drainage.  You may take Tessalon Perles every 8 hours as needed for cough.  Continue use of your albuterol inhaler as needed for cough, shortness of breath, and wheeze.  If you develop any new or worsening symptoms or do not  improve in the next 2 to 3 days, please return.  If your symptoms are severe, please go to the emergency room.  Follow-up with your primary care provider for further evaluation and management of your symptoms as well as ongoing wellness visits.  I hope you feel better!      ED Prescriptions     Medication Sig Dispense Auth. Provider   benzonatate (TESSALON) 100 MG capsule Take 1 capsule (100 mg total) by mouth every 8 (eight) hours. 21 capsule Talbot Grumbling, FNP      PDMP not reviewed this encounter.   Talbot Grumbling, [place] 04/11/22 2220

## 2022-04-08 NOTE — ED Triage Notes (Signed)
Chief Complaint: post nasal drip, sore throat, cough.   Onset: 1 week  OTC medications tried: Yes- dayQuil; Alka-Seltzer plus cold; Mucinex pills    with mild relief  Sick exposure: No  New foods or medications: No  Recent Travel: No

## 2022-04-25 DIAGNOSIS — F4320 Adjustment disorder, unspecified: Secondary | ICD-10-CM | POA: Diagnosis not present

## 2022-04-25 DIAGNOSIS — F439 Reaction to severe stress, unspecified: Secondary | ICD-10-CM | POA: Diagnosis not present

## 2022-04-25 DIAGNOSIS — F330 Major depressive disorder, recurrent, mild: Secondary | ICD-10-CM | POA: Diagnosis not present

## 2022-04-25 DIAGNOSIS — F419 Anxiety disorder, unspecified: Secondary | ICD-10-CM | POA: Diagnosis not present

## 2022-05-03 DIAGNOSIS — I10 Essential (primary) hypertension: Secondary | ICD-10-CM | POA: Diagnosis not present

## 2022-06-05 DIAGNOSIS — E65 Localized adiposity: Secondary | ICD-10-CM | POA: Diagnosis not present

## 2022-06-05 DIAGNOSIS — R948 Abnormal results of function studies of other organs and systems: Secondary | ICD-10-CM | POA: Diagnosis not present

## 2022-06-05 DIAGNOSIS — I10 Essential (primary) hypertension: Secondary | ICD-10-CM | POA: Diagnosis not present

## 2022-06-12 DIAGNOSIS — Z1211 Encounter for screening for malignant neoplasm of colon: Secondary | ICD-10-CM | POA: Diagnosis not present

## 2022-06-12 DIAGNOSIS — K644 Residual hemorrhoidal skin tags: Secondary | ICD-10-CM | POA: Diagnosis not present

## 2022-06-12 DIAGNOSIS — D123 Benign neoplasm of transverse colon: Secondary | ICD-10-CM | POA: Diagnosis not present

## 2022-08-30 DIAGNOSIS — N760 Acute vaginitis: Secondary | ICD-10-CM | POA: Diagnosis not present

## 2022-09-25 DIAGNOSIS — L309 Dermatitis, unspecified: Secondary | ICD-10-CM | POA: Diagnosis not present

## 2022-10-24 ENCOUNTER — Other Ambulatory Visit (HOSPITAL_COMMUNITY): Payer: Self-pay

## 2022-10-24 MED ORDER — LEVOCETIRIZINE DIHYDROCHLORIDE 5 MG PO TABS
5.0000 mg | ORAL_TABLET | Freq: Every evening | ORAL | 4 refills | Status: DC
  Filled 2022-10-24: qty 90, 90d supply, fill #0

## 2022-10-24 MED ORDER — BETAMETHASONE DIPROPIONATE AUG 0.05 % EX CREA
1.0000 | TOPICAL_CREAM | Freq: Two times a day (BID) | CUTANEOUS | 1 refills | Status: DC
  Filled 2022-10-24: qty 50, 30d supply, fill #0

## 2022-10-24 MED ORDER — CYCLOBENZAPRINE HCL 10 MG PO TABS
10.0000 mg | ORAL_TABLET | Freq: Every evening | ORAL | 1 refills | Status: AC | PRN
  Filled 2022-10-24 – 2022-12-12 (×2): qty 30, 30d supply, fill #0

## 2022-10-24 MED ORDER — TRIAMCINOLONE ACETONIDE 0.1 % EX OINT
1.0000 | TOPICAL_OINTMENT | Freq: Two times a day (BID) | CUTANEOUS | 2 refills | Status: DC
  Filled 2022-10-24: qty 30, 15d supply, fill #0

## 2022-10-24 MED ORDER — PANTOPRAZOLE SODIUM 20 MG PO TBEC
20.0000 mg | DELAYED_RELEASE_TABLET | Freq: Every day | ORAL | 10 refills | Status: DC
  Filled 2022-11-07: qty 30, 30d supply, fill #0
  Filled 2022-12-05: qty 30, 30d supply, fill #1
  Filled 2023-01-04: qty 30, 30d supply, fill #2
  Filled 2023-05-11: qty 30, 30d supply, fill #3

## 2022-10-24 MED ORDER — LEVOTHYROXINE SODIUM 112 MCG PO TABS
112.0000 ug | ORAL_TABLET | Freq: Every morning | ORAL | 2 refills | Status: DC
  Filled 2022-10-24: qty 90, 90d supply, fill #0

## 2022-10-24 MED ORDER — CITALOPRAM HYDROBROMIDE 20 MG PO TABS
20.0000 mg | ORAL_TABLET | Freq: Every day | ORAL | 0 refills | Status: DC
  Filled 2022-10-24: qty 90, 90d supply, fill #0

## 2022-10-24 MED ORDER — FLUTICASONE PROPIONATE 50 MCG/ACT NA SUSP
2.0000 | Freq: Every day | NASAL | 3 refills | Status: DC
  Filled 2022-10-24: qty 48, 90d supply, fill #0

## 2022-10-24 MED ORDER — HYDROCHLOROTHIAZIDE 25 MG PO TABS
25.0000 mg | ORAL_TABLET | Freq: Every morning | ORAL | 6 refills | Status: DC
  Filled 2022-11-07: qty 30, 30d supply, fill #0
  Filled 2022-12-12: qty 30, 30d supply, fill #1

## 2022-10-24 MED ORDER — METRONIDAZOLE 0.75 % VA GEL
1.0000 | VAGINAL | 0 refills | Status: DC
  Filled 2022-10-24: qty 70, 49d supply, fill #0

## 2022-11-07 ENCOUNTER — Other Ambulatory Visit (HOSPITAL_COMMUNITY): Payer: Self-pay

## 2022-12-05 ENCOUNTER — Other Ambulatory Visit: Payer: Self-pay

## 2022-12-05 ENCOUNTER — Other Ambulatory Visit (HOSPITAL_COMMUNITY): Payer: Self-pay

## 2022-12-06 DIAGNOSIS — K219 Gastro-esophageal reflux disease without esophagitis: Secondary | ICD-10-CM | POA: Diagnosis not present

## 2022-12-06 DIAGNOSIS — R1030 Lower abdominal pain, unspecified: Secondary | ICD-10-CM | POA: Diagnosis not present

## 2022-12-06 DIAGNOSIS — R109 Unspecified abdominal pain: Secondary | ICD-10-CM | POA: Diagnosis not present

## 2022-12-11 ENCOUNTER — Other Ambulatory Visit (HOSPITAL_COMMUNITY): Payer: Self-pay

## 2022-12-11 MED ORDER — METRONIDAZOLE 0.75 % VA GEL
1.0000 | VAGINAL | 0 refills | Status: DC
  Filled 2022-12-11: qty 210, 30d supply, fill #0
  Filled 2022-12-13: qty 210, 49d supply, fill #0
  Filled 2022-12-18: qty 70, 49d supply, fill #0
  Filled 2023-04-09: qty 70, 49d supply, fill #1

## 2022-12-12 ENCOUNTER — Other Ambulatory Visit (HOSPITAL_COMMUNITY): Payer: Self-pay

## 2022-12-12 ENCOUNTER — Other Ambulatory Visit: Payer: Self-pay

## 2022-12-13 ENCOUNTER — Other Ambulatory Visit: Payer: Self-pay

## 2022-12-13 ENCOUNTER — Encounter (HOSPITAL_COMMUNITY): Payer: Self-pay

## 2022-12-13 ENCOUNTER — Other Ambulatory Visit (HOSPITAL_COMMUNITY): Payer: Self-pay

## 2022-12-18 ENCOUNTER — Other Ambulatory Visit (HOSPITAL_COMMUNITY): Payer: Self-pay

## 2022-12-20 ENCOUNTER — Other Ambulatory Visit (HOSPITAL_COMMUNITY): Payer: Self-pay

## 2023-01-08 DIAGNOSIS — N898 Other specified noninflammatory disorders of vagina: Secondary | ICD-10-CM | POA: Diagnosis not present

## 2023-01-16 ENCOUNTER — Other Ambulatory Visit (HOSPITAL_COMMUNITY): Payer: Self-pay

## 2023-01-16 ENCOUNTER — Other Ambulatory Visit: Payer: Self-pay

## 2023-01-21 ENCOUNTER — Other Ambulatory Visit (HOSPITAL_COMMUNITY): Payer: Self-pay

## 2023-01-21 MED ORDER — HYDROCHLOROTHIAZIDE 25 MG PO TABS
25.0000 mg | ORAL_TABLET | Freq: Every morning | ORAL | 0 refills | Status: DC
  Filled 2023-01-21: qty 90, 90d supply, fill #0

## 2023-01-22 ENCOUNTER — Other Ambulatory Visit (HOSPITAL_COMMUNITY): Payer: Self-pay

## 2023-01-30 ENCOUNTER — Other Ambulatory Visit (HOSPITAL_COMMUNITY): Payer: Self-pay

## 2023-01-30 DIAGNOSIS — Z6841 Body Mass Index (BMI) 40.0 and over, adult: Secondary | ICD-10-CM | POA: Diagnosis not present

## 2023-01-30 DIAGNOSIS — I10 Essential (primary) hypertension: Secondary | ICD-10-CM | POA: Diagnosis not present

## 2023-01-30 DIAGNOSIS — E65 Localized adiposity: Secondary | ICD-10-CM | POA: Diagnosis not present

## 2023-01-30 DIAGNOSIS — R948 Abnormal results of function studies of other organs and systems: Secondary | ICD-10-CM | POA: Diagnosis not present

## 2023-01-30 MED ORDER — QSYMIA 7.5-46 MG PO CP24
1.0000 | ORAL_CAPSULE | Freq: Every day | ORAL | 1 refills | Status: DC
  Filled 2023-01-30 – 2023-01-31 (×2): qty 30, 30d supply, fill #0

## 2023-01-31 ENCOUNTER — Other Ambulatory Visit: Payer: Self-pay

## 2023-01-31 ENCOUNTER — Other Ambulatory Visit (HOSPITAL_COMMUNITY): Payer: Self-pay

## 2023-02-07 ENCOUNTER — Other Ambulatory Visit: Payer: Self-pay

## 2023-02-07 ENCOUNTER — Other Ambulatory Visit (HOSPITAL_COMMUNITY): Payer: Self-pay

## 2023-02-07 DIAGNOSIS — R700 Elevated erythrocyte sedimentation rate: Secondary | ICD-10-CM | POA: Diagnosis not present

## 2023-02-07 DIAGNOSIS — Z1389 Encounter for screening for other disorder: Secondary | ICD-10-CM | POA: Diagnosis not present

## 2023-02-07 DIAGNOSIS — E039 Hypothyroidism, unspecified: Secondary | ICD-10-CM | POA: Diagnosis not present

## 2023-02-07 DIAGNOSIS — Z13220 Encounter for screening for lipoid disorders: Secondary | ICD-10-CM | POA: Diagnosis not present

## 2023-02-07 DIAGNOSIS — F330 Major depressive disorder, recurrent, mild: Secondary | ICD-10-CM | POA: Diagnosis not present

## 2023-02-07 DIAGNOSIS — K219 Gastro-esophageal reflux disease without esophagitis: Secondary | ICD-10-CM | POA: Diagnosis not present

## 2023-02-07 DIAGNOSIS — Z0000 Encounter for general adult medical examination without abnormal findings: Secondary | ICD-10-CM | POA: Diagnosis not present

## 2023-02-07 DIAGNOSIS — J3089 Other allergic rhinitis: Secondary | ICD-10-CM | POA: Diagnosis not present

## 2023-02-07 MED ORDER — LEVOTHYROXINE SODIUM 112 MCG PO TABS
112.0000 ug | ORAL_TABLET | Freq: Every morning | ORAL | 5 refills | Status: DC
  Filled 2023-02-07: qty 90, 90d supply, fill #0
  Filled 2023-05-11: qty 90, 90d supply, fill #1

## 2023-02-07 MED ORDER — FLUTICASONE PROPIONATE 50 MCG/ACT NA SUSP
2.0000 | Freq: Every day | NASAL | 3 refills | Status: AC
  Filled 2023-02-07 – 2023-05-11 (×2): qty 48, 90d supply, fill #0
  Filled 2024-01-09: qty 48, 90d supply, fill #1

## 2023-02-07 MED ORDER — LEVOCETIRIZINE DIHYDROCHLORIDE 5 MG PO TABS
5.0000 mg | ORAL_TABLET | Freq: Every evening | ORAL | 5 refills | Status: DC
  Filled 2023-02-07: qty 90, 90d supply, fill #0
  Filled 2023-05-11: qty 90, 90d supply, fill #1
  Filled 2023-10-05: qty 90, 90d supply, fill #2
  Filled 2024-01-09: qty 90, 90d supply, fill #3

## 2023-02-07 MED ORDER — PANTOPRAZOLE SODIUM 20 MG PO TBEC
20.0000 mg | DELAYED_RELEASE_TABLET | Freq: Every day | ORAL | 5 refills | Status: DC
  Filled 2023-02-07: qty 90, 90d supply, fill #0
  Filled 2023-06-17: qty 90, 90d supply, fill #1
  Filled 2023-10-17: qty 90, 90d supply, fill #2

## 2023-02-07 MED ORDER — CITALOPRAM HYDROBROMIDE 20 MG PO TABS
20.0000 mg | ORAL_TABLET | Freq: Every day | ORAL | 5 refills | Status: DC
  Filled 2023-02-07 – 2023-04-09 (×2): qty 90, 90d supply, fill #0
  Filled 2023-07-10: qty 90, 90d supply, fill #1
  Filled 2023-10-17: qty 90, 90d supply, fill #2
  Filled 2024-01-19: qty 90, 90d supply, fill #3

## 2023-02-08 ENCOUNTER — Other Ambulatory Visit (HOSPITAL_COMMUNITY): Payer: Self-pay

## 2023-02-11 ENCOUNTER — Other Ambulatory Visit (HOSPITAL_COMMUNITY): Payer: Self-pay

## 2023-02-11 MED ORDER — TOPIRAMATE 25 MG PO TABS
25.0000 mg | ORAL_TABLET | Freq: Every day | ORAL | 0 refills | Status: DC
  Filled 2023-02-11: qty 30, 30d supply, fill #0

## 2023-02-11 MED ORDER — LOMAIRA 8 MG PO TABS
8.0000 mg | ORAL_TABLET | Freq: Every day | ORAL | 0 refills | Status: DC
Start: 2023-02-11 — End: 2023-02-21
  Filled 2023-02-11: qty 30, 30d supply, fill #0

## 2023-02-19 ENCOUNTER — Other Ambulatory Visit (HOSPITAL_COMMUNITY): Payer: Self-pay

## 2023-02-21 ENCOUNTER — Other Ambulatory Visit (HOSPITAL_COMMUNITY): Payer: Self-pay

## 2023-02-21 DIAGNOSIS — E66813 Obesity, class 3: Secondary | ICD-10-CM | POA: Diagnosis not present

## 2023-02-21 DIAGNOSIS — R948 Abnormal results of function studies of other organs and systems: Secondary | ICD-10-CM | POA: Diagnosis not present

## 2023-02-21 DIAGNOSIS — Z6841 Body Mass Index (BMI) 40.0 and over, adult: Secondary | ICD-10-CM | POA: Diagnosis not present

## 2023-02-21 DIAGNOSIS — I10 Essential (primary) hypertension: Secondary | ICD-10-CM | POA: Diagnosis not present

## 2023-02-21 DIAGNOSIS — E65 Localized adiposity: Secondary | ICD-10-CM | POA: Diagnosis not present

## 2023-02-21 MED ORDER — TOPIRAMATE 25 MG PO TABS
25.0000 mg | ORAL_TABLET | Freq: Every day | ORAL | 0 refills | Status: DC
  Filled 2023-03-11: qty 30, 30d supply, fill #0

## 2023-02-21 MED ORDER — LOMAIRA 8 MG PO TABS
8.0000 mg | ORAL_TABLET | Freq: Every day | ORAL | 0 refills | Status: DC
  Filled 2023-03-11: qty 30, 30d supply, fill #0

## 2023-02-27 ENCOUNTER — Other Ambulatory Visit (HOSPITAL_COMMUNITY): Payer: Self-pay

## 2023-03-01 ENCOUNTER — Other Ambulatory Visit (HOSPITAL_COMMUNITY): Payer: Self-pay

## 2023-03-11 ENCOUNTER — Other Ambulatory Visit (HOSPITAL_COMMUNITY): Payer: Self-pay

## 2023-03-11 ENCOUNTER — Other Ambulatory Visit: Payer: Self-pay

## 2023-03-12 ENCOUNTER — Other Ambulatory Visit (HOSPITAL_COMMUNITY): Payer: Self-pay

## 2023-03-21 ENCOUNTER — Other Ambulatory Visit (HOSPITAL_COMMUNITY): Payer: Self-pay

## 2023-03-21 MED ORDER — TOPIRAMATE 25 MG PO TABS
25.0000 mg | ORAL_TABLET | Freq: Every day | ORAL | 1 refills | Status: DC
  Filled 2023-03-21 – 2023-04-09 (×2): qty 30, 30d supply, fill #0
  Filled 2023-05-11: qty 30, 30d supply, fill #1

## 2023-03-21 MED ORDER — LOMAIRA 8 MG PO TABS
8.0000 mg | ORAL_TABLET | Freq: Every day | ORAL | 0 refills | Status: DC
  Filled 2023-03-21 – 2023-04-11 (×3): qty 30, 30d supply, fill #0

## 2023-04-09 ENCOUNTER — Other Ambulatory Visit (HOSPITAL_COMMUNITY): Payer: Self-pay

## 2023-04-09 ENCOUNTER — Other Ambulatory Visit: Payer: Self-pay

## 2023-04-12 ENCOUNTER — Other Ambulatory Visit (HOSPITAL_COMMUNITY): Payer: Self-pay

## 2023-04-12 ENCOUNTER — Other Ambulatory Visit: Payer: Self-pay

## 2023-04-26 ENCOUNTER — Other Ambulatory Visit (HOSPITAL_COMMUNITY): Payer: Self-pay

## 2023-04-26 MED ORDER — HYDROCHLOROTHIAZIDE 25 MG PO TABS
25.0000 mg | ORAL_TABLET | Freq: Every morning | ORAL | 3 refills | Status: DC
  Filled 2023-04-26: qty 90, 90d supply, fill #0
  Filled 2023-07-23: qty 90, 90d supply, fill #1
  Filled 2023-10-18: qty 90, 90d supply, fill #2
  Filled 2024-01-09: qty 90, 90d supply, fill #3

## 2023-04-29 ENCOUNTER — Other Ambulatory Visit (HOSPITAL_COMMUNITY): Payer: Self-pay

## 2023-05-11 ENCOUNTER — Other Ambulatory Visit: Payer: Self-pay

## 2023-05-12 ENCOUNTER — Other Ambulatory Visit (HOSPITAL_COMMUNITY): Payer: Self-pay

## 2023-05-13 ENCOUNTER — Other Ambulatory Visit: Payer: Self-pay

## 2023-05-16 ENCOUNTER — Other Ambulatory Visit (HOSPITAL_COMMUNITY): Payer: Self-pay

## 2023-05-16 MED ORDER — LOMAIRA 8 MG PO TABS
8.0000 mg | ORAL_TABLET | Freq: Every day | ORAL | 0 refills | Status: DC
  Filled 2023-05-16: qty 30, 30d supply, fill #0

## 2023-05-22 ENCOUNTER — Other Ambulatory Visit (HOSPITAL_COMMUNITY): Payer: Self-pay

## 2023-06-05 ENCOUNTER — Other Ambulatory Visit (HOSPITAL_COMMUNITY): Payer: Self-pay

## 2023-06-05 DIAGNOSIS — I10 Essential (primary) hypertension: Secondary | ICD-10-CM | POA: Diagnosis not present

## 2023-06-05 DIAGNOSIS — E65 Localized adiposity: Secondary | ICD-10-CM | POA: Diagnosis not present

## 2023-06-05 DIAGNOSIS — Z6841 Body Mass Index (BMI) 40.0 and over, adult: Secondary | ICD-10-CM | POA: Diagnosis not present

## 2023-06-05 DIAGNOSIS — E66813 Obesity, class 3: Secondary | ICD-10-CM | POA: Diagnosis not present

## 2023-06-05 DIAGNOSIS — Z79899 Other long term (current) drug therapy: Secondary | ICD-10-CM | POA: Diagnosis not present

## 2023-06-05 DIAGNOSIS — R948 Abnormal results of function studies of other organs and systems: Secondary | ICD-10-CM | POA: Diagnosis not present

## 2023-06-05 MED ORDER — TOPIRAMATE 25 MG PO TABS
25.0000 mg | ORAL_TABLET | Freq: Every day | ORAL | 1 refills | Status: DC
  Filled 2023-06-17: qty 30, 30d supply, fill #0
  Filled 2023-10-05: qty 30, 30d supply, fill #1

## 2023-06-05 MED ORDER — LOMAIRA 8 MG PO TABS
8.0000 mg | ORAL_TABLET | Freq: Every day | ORAL | 0 refills | Status: DC
  Filled 2023-06-17: qty 30, 30d supply, fill #0

## 2023-06-11 DIAGNOSIS — Z1231 Encounter for screening mammogram for malignant neoplasm of breast: Secondary | ICD-10-CM | POA: Diagnosis not present

## 2023-06-11 DIAGNOSIS — Z113 Encounter for screening for infections with a predominantly sexual mode of transmission: Secondary | ICD-10-CM | POA: Diagnosis not present

## 2023-06-11 DIAGNOSIS — R82998 Other abnormal findings in urine: Secondary | ICD-10-CM | POA: Diagnosis not present

## 2023-06-11 DIAGNOSIS — Z01411 Encounter for gynecological examination (general) (routine) with abnormal findings: Secondary | ICD-10-CM | POA: Diagnosis not present

## 2023-06-11 DIAGNOSIS — N938 Other specified abnormal uterine and vaginal bleeding: Secondary | ICD-10-CM | POA: Diagnosis not present

## 2023-06-11 DIAGNOSIS — R8290 Unspecified abnormal findings in urine: Secondary | ICD-10-CM | POA: Diagnosis not present

## 2023-06-17 ENCOUNTER — Other Ambulatory Visit: Payer: Self-pay

## 2023-06-17 ENCOUNTER — Other Ambulatory Visit (HOSPITAL_COMMUNITY): Payer: Self-pay

## 2023-07-11 ENCOUNTER — Other Ambulatory Visit: Payer: Self-pay

## 2023-07-11 ENCOUNTER — Other Ambulatory Visit (HOSPITAL_COMMUNITY): Payer: Self-pay

## 2023-07-11 MED ORDER — LOMAIRA 8 MG PO TABS
16.0000 mg | ORAL_TABLET | Freq: Every day | ORAL | 1 refills | Status: DC
  Filled 2023-07-11: qty 60, 30d supply, fill #0
  Filled 2023-08-10: qty 60, 30d supply, fill #1

## 2023-07-11 MED ORDER — TOPIRAMATE 25 MG PO TABS
25.0000 mg | ORAL_TABLET | Freq: Every day | ORAL | 1 refills | Status: DC
  Filled 2023-07-11: qty 30, 30d supply, fill #0
  Filled 2023-08-10: qty 30, 30d supply, fill #1

## 2023-07-17 ENCOUNTER — Other Ambulatory Visit (HOSPITAL_COMMUNITY): Payer: Self-pay

## 2023-08-08 ENCOUNTER — Other Ambulatory Visit (HOSPITAL_COMMUNITY): Payer: Self-pay

## 2023-08-08 DIAGNOSIS — I10 Essential (primary) hypertension: Secondary | ICD-10-CM | POA: Diagnosis not present

## 2023-08-08 DIAGNOSIS — F330 Major depressive disorder, recurrent, mild: Secondary | ICD-10-CM | POA: Diagnosis not present

## 2023-08-08 DIAGNOSIS — E039 Hypothyroidism, unspecified: Secondary | ICD-10-CM | POA: Diagnosis not present

## 2023-08-08 DIAGNOSIS — K219 Gastro-esophageal reflux disease without esophagitis: Secondary | ICD-10-CM | POA: Diagnosis not present

## 2023-08-08 DIAGNOSIS — F419 Anxiety disorder, unspecified: Secondary | ICD-10-CM | POA: Diagnosis not present

## 2023-08-08 MED ORDER — OMEPRAZOLE 40 MG PO CPDR
40.0000 mg | DELAYED_RELEASE_CAPSULE | Freq: Every day | ORAL | 5 refills | Status: AC
  Filled 2023-08-08: qty 90, 90d supply, fill #0
  Filled 2023-11-18: qty 90, 90d supply, fill #1
  Filled 2024-02-14: qty 90, 90d supply, fill #2
  Filled 2024-05-24: qty 90, 90d supply, fill #3
  Filled 2024-05-25: qty 90, 90d supply, fill #0

## 2023-08-09 ENCOUNTER — Other Ambulatory Visit (HOSPITAL_COMMUNITY): Payer: Self-pay

## 2023-08-09 MED ORDER — LEVOTHYROXINE SODIUM 100 MCG PO TABS
100.0000 ug | ORAL_TABLET | Freq: Every morning | ORAL | 5 refills | Status: DC
  Filled 2023-08-09: qty 90, 90d supply, fill #0
  Filled 2023-10-05: qty 90, 90d supply, fill #1

## 2023-08-12 ENCOUNTER — Other Ambulatory Visit (HOSPITAL_COMMUNITY): Payer: Self-pay

## 2023-08-12 ENCOUNTER — Other Ambulatory Visit: Payer: Self-pay

## 2023-09-05 ENCOUNTER — Other Ambulatory Visit (HOSPITAL_COMMUNITY): Payer: Self-pay

## 2023-09-06 ENCOUNTER — Other Ambulatory Visit (HOSPITAL_COMMUNITY): Payer: Self-pay

## 2023-09-06 MED ORDER — ALBUTEROL SULFATE HFA 108 (90 BASE) MCG/ACT IN AERS
2.0000 | INHALATION_SPRAY | Freq: Four times a day (QID) | RESPIRATORY_TRACT | 5 refills | Status: AC | PRN
  Filled 2023-09-06: qty 6.7, 25d supply, fill #0
  Filled 2023-10-05: qty 6.7, 25d supply, fill #1

## 2023-09-11 ENCOUNTER — Other Ambulatory Visit (HOSPITAL_COMMUNITY): Payer: Self-pay

## 2023-09-11 DIAGNOSIS — Z6841 Body Mass Index (BMI) 40.0 and over, adult: Secondary | ICD-10-CM | POA: Diagnosis not present

## 2023-09-11 DIAGNOSIS — Z79899 Other long term (current) drug therapy: Secondary | ICD-10-CM | POA: Diagnosis not present

## 2023-09-11 DIAGNOSIS — E66813 Obesity, class 3: Secondary | ICD-10-CM | POA: Diagnosis not present

## 2023-09-11 DIAGNOSIS — R948 Abnormal results of function studies of other organs and systems: Secondary | ICD-10-CM | POA: Diagnosis not present

## 2023-09-11 DIAGNOSIS — I10 Essential (primary) hypertension: Secondary | ICD-10-CM | POA: Diagnosis not present

## 2023-09-11 DIAGNOSIS — E65 Localized adiposity: Secondary | ICD-10-CM | POA: Diagnosis not present

## 2023-09-11 MED ORDER — TOPIRAMATE 25 MG PO TABS
25.0000 mg | ORAL_TABLET | Freq: Every day | ORAL | 1 refills | Status: DC
Start: 2023-09-11 — End: 2024-04-05
  Filled 2023-09-11: qty 60, 30d supply, fill #0
  Filled 2023-10-17 – 2023-11-18 (×2): qty 60, 30d supply, fill #1

## 2023-10-05 ENCOUNTER — Other Ambulatory Visit (HOSPITAL_COMMUNITY): Payer: Self-pay

## 2023-10-06 ENCOUNTER — Other Ambulatory Visit: Payer: Self-pay

## 2023-10-10 DIAGNOSIS — E039 Hypothyroidism, unspecified: Secondary | ICD-10-CM | POA: Diagnosis not present

## 2023-10-11 ENCOUNTER — Other Ambulatory Visit (HOSPITAL_COMMUNITY): Payer: Self-pay

## 2023-10-11 MED ORDER — LEVOTHYROXINE SODIUM 88 MCG PO TABS
88.0000 ug | ORAL_TABLET | Freq: Every morning | ORAL | 12 refills | Status: DC
  Filled 2023-10-11: qty 30, 30d supply, fill #0
  Filled 2023-11-18: qty 30, 30d supply, fill #1
  Filled 2023-12-16: qty 30, 30d supply, fill #2
  Filled 2024-01-19: qty 30, 30d supply, fill #3
  Filled 2024-02-14: qty 30, 30d supply, fill #4
  Filled 2024-03-19: qty 30, 30d supply, fill #5

## 2023-10-17 ENCOUNTER — Other Ambulatory Visit: Payer: Self-pay

## 2023-10-17 ENCOUNTER — Other Ambulatory Visit (HOSPITAL_COMMUNITY): Payer: Self-pay

## 2023-10-21 ENCOUNTER — Other Ambulatory Visit (HOSPITAL_COMMUNITY): Payer: Self-pay

## 2023-10-21 DIAGNOSIS — Z79899 Other long term (current) drug therapy: Secondary | ICD-10-CM | POA: Diagnosis not present

## 2023-10-21 DIAGNOSIS — R948 Abnormal results of function studies of other organs and systems: Secondary | ICD-10-CM | POA: Diagnosis not present

## 2023-10-21 DIAGNOSIS — E65 Localized adiposity: Secondary | ICD-10-CM | POA: Diagnosis not present

## 2023-10-21 DIAGNOSIS — E66813 Obesity, class 3: Secondary | ICD-10-CM | POA: Diagnosis not present

## 2023-10-21 DIAGNOSIS — I10 Essential (primary) hypertension: Secondary | ICD-10-CM | POA: Diagnosis not present

## 2023-10-21 DIAGNOSIS — Z6841 Body Mass Index (BMI) 40.0 and over, adult: Secondary | ICD-10-CM | POA: Diagnosis not present

## 2023-10-21 MED ORDER — LOMAIRA 8 MG PO TABS
8.0000 mg | ORAL_TABLET | Freq: Every day | ORAL | 1 refills | Status: DC
  Filled 2023-10-21: qty 30, 30d supply, fill #0
  Filled 2023-11-18: qty 30, 30d supply, fill #1

## 2023-10-21 MED ORDER — TOPIRAMATE 25 MG PO TABS
25.0000 mg | ORAL_TABLET | Freq: Every day | ORAL | 1 refills | Status: DC
  Filled 2023-10-21: qty 60, 60d supply, fill #0
  Filled 2023-12-04 – 2023-12-12 (×2): qty 60, 30d supply, fill #0
  Filled 2024-01-09: qty 60, 30d supply, fill #1

## 2023-11-18 ENCOUNTER — Other Ambulatory Visit: Payer: Self-pay

## 2023-11-18 DIAGNOSIS — E0590 Thyrotoxicosis, unspecified without thyrotoxic crisis or storm: Secondary | ICD-10-CM | POA: Diagnosis not present

## 2023-12-02 ENCOUNTER — Other Ambulatory Visit (HOSPITAL_COMMUNITY): Payer: Self-pay

## 2023-12-02 DIAGNOSIS — J45909 Unspecified asthma, uncomplicated: Secondary | ICD-10-CM | POA: Diagnosis not present

## 2023-12-02 DIAGNOSIS — R059 Cough, unspecified: Secondary | ICD-10-CM | POA: Diagnosis not present

## 2023-12-02 DIAGNOSIS — R0982 Postnasal drip: Secondary | ICD-10-CM | POA: Diagnosis not present

## 2023-12-02 MED ORDER — PREDNISONE 10 MG (21) PO TBPK
ORAL_TABLET | ORAL | 0 refills | Status: DC
  Filled 2023-12-02 – 2023-12-12 (×2): qty 21, 6d supply, fill #0

## 2023-12-04 ENCOUNTER — Encounter (HOSPITAL_COMMUNITY): Payer: Self-pay | Admitting: Pharmacist

## 2023-12-04 ENCOUNTER — Other Ambulatory Visit (HOSPITAL_COMMUNITY): Payer: Self-pay

## 2023-12-04 ENCOUNTER — Other Ambulatory Visit: Payer: Self-pay

## 2023-12-04 DIAGNOSIS — Z6841 Body Mass Index (BMI) 40.0 and over, adult: Secondary | ICD-10-CM | POA: Diagnosis not present

## 2023-12-04 DIAGNOSIS — Z79899 Other long term (current) drug therapy: Secondary | ICD-10-CM | POA: Diagnosis not present

## 2023-12-04 DIAGNOSIS — R948 Abnormal results of function studies of other organs and systems: Secondary | ICD-10-CM | POA: Diagnosis not present

## 2023-12-04 DIAGNOSIS — I10 Essential (primary) hypertension: Secondary | ICD-10-CM | POA: Diagnosis not present

## 2023-12-04 DIAGNOSIS — E66813 Obesity, class 3: Secondary | ICD-10-CM | POA: Diagnosis not present

## 2023-12-11 ENCOUNTER — Other Ambulatory Visit (HOSPITAL_COMMUNITY): Payer: Self-pay

## 2023-12-12 ENCOUNTER — Other Ambulatory Visit: Payer: Self-pay

## 2023-12-12 ENCOUNTER — Other Ambulatory Visit (HOSPITAL_COMMUNITY): Payer: Self-pay

## 2024-02-20 ENCOUNTER — Other Ambulatory Visit (HOSPITAL_COMMUNITY): Payer: Self-pay

## 2024-02-20 DIAGNOSIS — K219 Gastro-esophageal reflux disease without esophagitis: Secondary | ICD-10-CM | POA: Diagnosis not present

## 2024-02-20 DIAGNOSIS — Z1389 Encounter for screening for other disorder: Secondary | ICD-10-CM | POA: Diagnosis not present

## 2024-02-20 DIAGNOSIS — E039 Hypothyroidism, unspecified: Secondary | ICD-10-CM | POA: Diagnosis not present

## 2024-02-20 DIAGNOSIS — L920 Granuloma annulare: Secondary | ICD-10-CM | POA: Diagnosis not present

## 2024-02-20 DIAGNOSIS — D509 Iron deficiency anemia, unspecified: Secondary | ICD-10-CM | POA: Diagnosis not present

## 2024-02-20 DIAGNOSIS — F419 Anxiety disorder, unspecified: Secondary | ICD-10-CM | POA: Diagnosis not present

## 2024-02-20 DIAGNOSIS — Z0000 Encounter for general adult medical examination without abnormal findings: Secondary | ICD-10-CM | POA: Diagnosis not present

## 2024-02-20 DIAGNOSIS — I10 Essential (primary) hypertension: Secondary | ICD-10-CM | POA: Diagnosis not present

## 2024-02-20 MED ORDER — TOPIRAMATE 25 MG PO TABS
25.0000 mg | ORAL_TABLET | Freq: Every day | ORAL | 2 refills | Status: AC
  Filled 2024-02-20: qty 30, 30d supply, fill #0
  Filled 2024-03-19: qty 30, 30d supply, fill #1

## 2024-02-20 MED ORDER — PHENTERMINE HCL 8 MG PO TABS
1.0000 | ORAL_TABLET | Freq: Every morning | ORAL | 2 refills | Status: DC
  Filled 2024-02-20: qty 30, 30d supply, fill #0
  Filled 2024-03-22: qty 30, 30d supply, fill #1

## 2024-02-21 ENCOUNTER — Other Ambulatory Visit (HOSPITAL_COMMUNITY): Payer: Self-pay

## 2024-02-21 MED ORDER — VITAMIN D (ERGOCALCIFEROL) 50000 UNITS PO CAPS
1.0000 | ORAL_CAPSULE | ORAL | 1 refills | Status: AC
  Filled 2024-02-21: qty 12, 84d supply, fill #0
  Filled 2024-05-24: qty 12, 84d supply, fill #1
  Filled 2024-05-25: qty 12, 84d supply, fill #0

## 2024-03-23 ENCOUNTER — Other Ambulatory Visit: Payer: Self-pay

## 2024-04-05 ENCOUNTER — Emergency Department (HOSPITAL_COMMUNITY)

## 2024-04-05 ENCOUNTER — Other Ambulatory Visit: Payer: Self-pay

## 2024-04-05 ENCOUNTER — Encounter (HOSPITAL_COMMUNITY): Payer: Self-pay | Admitting: Pharmacy Technician

## 2024-04-05 ENCOUNTER — Inpatient Hospital Stay (HOSPITAL_COMMUNITY)
Admission: EM | Admit: 2024-04-05 | Discharge: 2024-04-07 | DRG: 287 | Disposition: A | Discharge status: Home / Self Care | Attending: Family Medicine | Admitting: Family Medicine

## 2024-04-05 DIAGNOSIS — R7989 Other specified abnormal findings of blood chemistry: Secondary | ICD-10-CM | POA: Diagnosis present

## 2024-04-05 DIAGNOSIS — E039 Hypothyroidism, unspecified: Secondary | ICD-10-CM | POA: Diagnosis not present

## 2024-04-05 DIAGNOSIS — Z79899 Other long term (current) drug therapy: Secondary | ICD-10-CM | POA: Diagnosis not present

## 2024-04-05 DIAGNOSIS — K219 Gastro-esophageal reflux disease without esophagitis: Secondary | ICD-10-CM | POA: Diagnosis present

## 2024-04-05 DIAGNOSIS — I214 Non-ST elevation (NSTEMI) myocardial infarction: Secondary | ICD-10-CM | POA: Diagnosis present

## 2024-04-05 DIAGNOSIS — R0789 Other chest pain: Secondary | ICD-10-CM | POA: Diagnosis not present

## 2024-04-05 DIAGNOSIS — Z8611 Personal history of tuberculosis: Secondary | ICD-10-CM

## 2024-04-05 DIAGNOSIS — Z880 Allergy status to penicillin: Secondary | ICD-10-CM

## 2024-04-05 DIAGNOSIS — G4440 Drug-induced headache, not elsewhere classified, not intractable: Secondary | ICD-10-CM | POA: Diagnosis present

## 2024-04-05 DIAGNOSIS — T463X5A Adverse effect of coronary vasodilators, initial encounter: Secondary | ICD-10-CM | POA: Diagnosis present

## 2024-04-05 DIAGNOSIS — E782 Mixed hyperlipidemia: Secondary | ICD-10-CM | POA: Diagnosis not present

## 2024-04-05 DIAGNOSIS — J4520 Mild intermittent asthma, uncomplicated: Secondary | ICD-10-CM | POA: Diagnosis not present

## 2024-04-05 DIAGNOSIS — Z90710 Acquired absence of both cervix and uterus: Secondary | ICD-10-CM

## 2024-04-05 DIAGNOSIS — Z8249 Family history of ischemic heart disease and other diseases of the circulatory system: Secondary | ICD-10-CM

## 2024-04-05 DIAGNOSIS — F17290 Nicotine dependence, other tobacco product, uncomplicated: Secondary | ICD-10-CM | POA: Diagnosis present

## 2024-04-05 DIAGNOSIS — E66813 Obesity, class 3: Secondary | ICD-10-CM | POA: Diagnosis present

## 2024-04-05 DIAGNOSIS — I10 Essential (primary) hypertension: Secondary | ICD-10-CM | POA: Diagnosis present

## 2024-04-05 DIAGNOSIS — R9431 Abnormal electrocardiogram [ECG] [EKG]: Secondary | ICD-10-CM | POA: Diagnosis present

## 2024-04-05 DIAGNOSIS — Z862 Personal history of diseases of the blood and blood-forming organs and certain disorders involving the immune mechanism: Secondary | ICD-10-CM | POA: Diagnosis not present

## 2024-04-05 DIAGNOSIS — I401 Isolated myocarditis: Secondary | ICD-10-CM | POA: Diagnosis not present

## 2024-04-05 DIAGNOSIS — R079 Chest pain, unspecified: Principal | ICD-10-CM

## 2024-04-05 DIAGNOSIS — E785 Hyperlipidemia, unspecified: Secondary | ICD-10-CM | POA: Diagnosis not present

## 2024-04-05 DIAGNOSIS — I4581 Long QT syndrome: Secondary | ICD-10-CM | POA: Diagnosis present

## 2024-04-05 DIAGNOSIS — Z6841 Body Mass Index (BMI) 40.0 and over, adult: Secondary | ICD-10-CM | POA: Diagnosis not present

## 2024-04-05 DIAGNOSIS — M79602 Pain in left arm: Secondary | ICD-10-CM | POA: Diagnosis not present

## 2024-04-05 DIAGNOSIS — Z7982 Long term (current) use of aspirin: Secondary | ICD-10-CM

## 2024-04-05 DIAGNOSIS — I409 Acute myocarditis, unspecified: Principal | ICD-10-CM | POA: Diagnosis present

## 2024-04-05 DIAGNOSIS — Z79890 Hormone replacement therapy (postmenopausal): Secondary | ICD-10-CM

## 2024-04-05 DIAGNOSIS — Z7952 Long term (current) use of systemic steroids: Secondary | ICD-10-CM | POA: Diagnosis not present

## 2024-04-05 DIAGNOSIS — F419 Anxiety disorder, unspecified: Secondary | ICD-10-CM | POA: Diagnosis present

## 2024-04-05 DIAGNOSIS — I2510 Atherosclerotic heart disease of native coronary artery without angina pectoris: Secondary | ICD-10-CM | POA: Diagnosis present

## 2024-04-05 LAB — LIPID PANEL
Cholesterol: 170 mg/dL (ref 0–200)
HDL: 76 mg/dL (ref >40)
LDL Cholesterol: 79 mg/dL (ref 0–99)
Total CHOL/HDL Ratio: 2.2 ratio
Triglycerides: 74 mg/dL (ref <150)
VLDL: 15 mg/dL (ref 0–40)

## 2024-04-05 LAB — BASIC METABOLIC PANEL WITH GFR
Anion gap: 15 (ref 5–15)
BUN: 7 mg/dL (ref 6–20)
CO2: 19 mmol/L — ABNORMAL LOW (ref 22–32)
Calcium: 9 mg/dL (ref 8.9–10.3)
Chloride: 102 mmol/L (ref 98–111)
Creatinine, Ser: 0.67 mg/dL (ref 0.44–1.00)
GFR, Estimated: >60 mL/min (ref >60)
Glucose, Bld: 97 mg/dL (ref 70–99)
Potassium: 3.2 mmol/L — ABNORMAL LOW (ref 3.5–5.1)
Sodium: 136 mmol/L (ref 135–145)

## 2024-04-05 LAB — HEMOGLOBIN A1C
Hgb A1c MFr Bld: 4 % — ABNORMAL LOW (ref 4.8–5.6)
Mean Plasma Glucose: 68.1 mg/dL

## 2024-04-05 LAB — CBC
HCT: 38.6 % (ref 36.0–46.0)
Hemoglobin: 11.9 g/dL — ABNORMAL LOW (ref 12.0–15.0)
MCH: 26.4 pg (ref 26.0–34.0)
MCHC: 30.8 g/dL (ref 30.0–36.0)
MCV: 85.6 fL (ref 80.0–100.0)
Platelets: 315 K/uL (ref 150–400)
RBC: 4.51 MIL/uL (ref 3.87–5.11)
RDW: 12.4 % (ref 11.5–15.5)
WBC: 4.2 K/uL (ref 4.0–10.5)
nRBC: 0 % (ref 0.0–0.2)

## 2024-04-05 LAB — TROPONIN I (HIGH SENSITIVITY)
Troponin I (High Sensitivity): 3 ng/L (ref <18)
Troponin I (High Sensitivity): 69 ng/L — ABNORMAL HIGH (ref <18)
Troponin I (High Sensitivity): 303 ng/L (ref <18)
Troponin I (High Sensitivity): 427 ng/L (ref <18)

## 2024-04-05 LAB — MAGNESIUM: Magnesium: 2.1 mg/dL (ref 1.7–2.4)

## 2024-04-05 LAB — HIV ANTIBODY (ROUTINE TESTING W REFLEX): HIV Screen 4th Generation wRfx: NONREACTIVE

## 2024-04-05 MED ORDER — SENNOSIDES-DOCUSATE SODIUM 8.6-50 MG PO TABS: 1.0000 | ORAL_TABLET | Freq: Every day | ORAL | Status: DC | PRN

## 2024-04-05 MED ORDER — ONDANSETRON HCL 4 MG/2ML IJ SOLN
4.0000 mg | Freq: Four times a day (QID) | INTRAMUSCULAR | Status: DC | PRN
Start: 2024-04-05 — End: 2024-04-05

## 2024-04-05 MED ORDER — ALBUTEROL SULFATE (2.5 MG/3ML) 0.083% IN NEBU: 3.0000 mL | INHALATION_SOLUTION | Freq: Four times a day (QID) | RESPIRATORY_TRACT | Status: DC | PRN

## 2024-04-05 MED ORDER — ASPIRIN 81 MG PO CHEW
324.0000 mg | CHEWABLE_TABLET | Freq: Once | ORAL | Status: AC
  Administered 2024-04-05: 324 mg via ORAL
  Filled 2024-04-05: qty 4

## 2024-04-05 MED ORDER — NITROGLYCERIN 0.4 MG SL SUBL
0.4000 mg | SUBLINGUAL_TABLET | SUBLINGUAL | Status: DC | PRN
  Administered 2024-04-06: 0.4 mg via SUBLINGUAL
  Filled 2024-04-05: qty 1

## 2024-04-05 MED ORDER — HEPARIN BOLUS VIA INFUSION
4000.0000 [IU] | Freq: Once | INTRAVENOUS | Status: AC
  Administered 2024-04-05: 4000 [IU] via INTRAVENOUS
  Filled 2024-04-05: qty 4000

## 2024-04-05 MED ORDER — LEVOTHYROXINE SODIUM 88 MCG PO TABS
88.0000 ug | ORAL_TABLET | Freq: Every day | ORAL | Status: DC
  Administered 2024-04-07: 88 ug via ORAL
  Filled 2024-04-05 (×2): qty 1

## 2024-04-05 MED ORDER — CLOPIDOGREL BISULFATE 75 MG PO TABS
75.0000 mg | ORAL_TABLET | Freq: Every day | ORAL | Status: DC
  Administered 2024-04-05: 75 mg via ORAL
  Filled 2024-04-05: qty 1

## 2024-04-05 MED ORDER — ATORVASTATIN CALCIUM 40 MG PO TABS
40.0000 mg | ORAL_TABLET | Freq: Every day | ORAL | Status: DC
  Administered 2024-04-05 – 2024-04-07 (×3): 40 mg via ORAL
  Filled 2024-04-05 (×3): qty 1

## 2024-04-05 MED ORDER — CYCLOBENZAPRINE HCL 10 MG PO TABS
10.0000 mg | ORAL_TABLET | Freq: Every evening | ORAL | Status: DC | PRN
Start: 2024-04-05 — End: 2024-04-07

## 2024-04-05 MED ORDER — FLUTICASONE PROPIONATE 50 MCG/ACT NA SUSP: 2.0000 | Freq: Every day | NASAL | Status: DC

## 2024-04-05 MED ORDER — MORPHINE SULFATE (PF) 2 MG/ML IV SOLN
2.0000 mg | INTRAVENOUS | Status: DC | PRN
  Administered 2024-04-06: 2 mg via INTRAVENOUS
  Filled 2024-04-05: qty 1

## 2024-04-05 MED ORDER — ASPIRIN 81 MG PO TBEC
81.0000 mg | DELAYED_RELEASE_TABLET | Freq: Every day | ORAL | Status: DC
  Administered 2024-04-05 – 2024-04-07 (×2): 81 mg via ORAL
  Filled 2024-04-05 (×2): qty 1

## 2024-04-05 MED ORDER — ONDANSETRON HCL 4 MG PO TABS
4.0000 mg | ORAL_TABLET | Freq: Four times a day (QID) | ORAL | Status: DC | PRN
Start: 2024-04-05 — End: 2024-04-05

## 2024-04-05 MED ORDER — PROCHLORPERAZINE EDISYLATE 10 MG/2ML IJ SOLN
10.0000 mg | Freq: Four times a day (QID) | INTRAMUSCULAR | Status: DC | PRN
Start: 2024-04-05 — End: 2024-04-07

## 2024-04-05 MED ORDER — ACETAMINOPHEN 650 MG RE SUPP
650.0000 mg | Freq: Four times a day (QID) | RECTAL | Status: DC | PRN
Start: 2024-04-05 — End: 2024-04-07

## 2024-04-05 MED ORDER — TOPIRAMATE 25 MG PO TABS
25.0000 mg | ORAL_TABLET | Freq: Every day | ORAL | Status: DC
  Administered 2024-04-06 – 2024-04-07 (×2): 25 mg via ORAL
  Filled 2024-04-05 (×2): qty 1

## 2024-04-05 MED ORDER — METOPROLOL TARTRATE 25 MG PO TABS: 12.5000 mg | ORAL_TABLET | Freq: Two times a day (BID) | ORAL | Status: DC

## 2024-04-05 MED ORDER — OXYCODONE-ACETAMINOPHEN 5-325 MG PO TABS
1.0000 | ORAL_TABLET | Freq: Four times a day (QID) | ORAL | Status: DC | PRN
  Filled 2024-04-05: qty 1

## 2024-04-05 MED ORDER — HEPARIN (PORCINE) 25000 UT/250ML-% IV SOLN
850.0000 [IU]/h | INTRAVENOUS | Status: DC
  Administered 2024-04-05: 850 [IU]/h via INTRAVENOUS
  Filled 2024-04-05: qty 250

## 2024-04-05 MED ORDER — METOPROLOL TARTRATE 12.5 MG HALF TABLET
12.5000 mg | ORAL_TABLET | Freq: Two times a day (BID) | ORAL | Status: DC
  Administered 2024-04-05 – 2024-04-06 (×3): 12.5 mg via ORAL
  Filled 2024-04-05 (×3): qty 1

## 2024-04-05 MED ORDER — ACETAMINOPHEN 325 MG PO TABS: 650.0000 mg | ORAL_TABLET | Freq: Four times a day (QID) | ORAL | Status: DC | PRN

## 2024-04-05 MED ORDER — PANTOPRAZOLE SODIUM 40 MG PO TBEC
40.0000 mg | DELAYED_RELEASE_TABLET | Freq: Every day | ORAL | Status: DC
  Administered 2024-04-05 – 2024-04-07 (×3): 40 mg via ORAL
  Filled 2024-04-05 (×3): qty 1

## 2024-04-05 MED ORDER — FERROUS SULFATE 325 (65 FE) MG PO TABS
325.0000 mg | ORAL_TABLET | Freq: Every day | ORAL | Status: DC
  Administered 2024-04-07: 325 mg via ORAL
  Filled 2024-04-05 (×2): qty 1

## 2024-04-05 MED ORDER — LEVOTHYROXINE SODIUM 112 MCG PO TABS: 112.0000 ug | ORAL_TABLET | Freq: Every day | ORAL | Status: DC

## 2024-04-05 MED ORDER — POTASSIUM CHLORIDE CRYS ER 20 MEQ PO TBCR
40.0000 meq | EXTENDED_RELEASE_TABLET | Freq: Once | ORAL | Status: AC
  Administered 2024-04-05: 40 meq via ORAL
  Filled 2024-04-05: qty 2

## 2024-04-05 MED ORDER — LORATADINE 10 MG PO TABS
10.0000 mg | ORAL_TABLET | Freq: Every day | ORAL | Status: DC
  Administered 2024-04-06 – 2024-04-07 (×2): 10 mg via ORAL
  Filled 2024-04-05 (×2): qty 1

## 2024-04-05 NOTE — H&P (Signed)
 History and Physical    DAO MEARNS FMW:[id number] DOB: 1975/11/12 DOA: 04/05/2024  PCP: Ransom Other, MD Patient coming from: Home  Chief Complaint:   HPI: Samantha Robinson is a 48 y.o. female with medical history significant of hypothyroidism, hypertension, hyperlipidemia, GERD, morbid obesity class III, and asthma presented to hospital complaint of chest pain.  Patient states that she got into work here at [employer] around 1 PM this afternoon.  After getting in she said that she started to experience pressure-like chest pain midsternal region 7 8 out of 10 in intensity and associated with left arm numbness and tingling.  Some shortness of breath no diaphoresis.  Said she took a couple deep breaths but symptoms were not improving and therefore she decided to come to the emergency room for evaluation and treatment.  ED Course:  In the ER, BP 132/82, HR 76, R 16 R, O2 saturation 100% on room air,  and Tmax 98.5. Cbc demonstrated wbc,  hb/hct, and platelet. Chemistry demonstrated Na 136, K 3.2, Cl 102, bicarb 19, Bun/Cr 7/0.67 and glucose 97. Anion gap 15. Initial troponin was 3 and repeat was 69.  CXR demonstrated no acute cardiopulmonary findings. Urinalysis demonstrated. EKG demonstrated normal sinus rhythm prolonged QT with inverted t wave in anterolateral leads.  Review of Systems:  All systems reviewed and apart from history of presenting illness, are negative.  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 She has never used smokeless tobacco. She reports current alcohol use. She reports that she does not use drugs.  Allergies  Allergen Reactions   Penicillins Nausea Only    Has patient had a PCN reaction causing immediate rash, facial/tongue/throat swelling, SOB or lightheadedness with hypotension: No Has patient had a PCN reaction causing severe rash involving mucus membranes or skin necrosis: No Has patient had a PCN reaction that required hospitalization: No Has patient had a PCN reaction occurring within the last 10 years: No If all of the above answers are NO, then may proceed with Cephalosporin use.    History reviewed. No pertinent family history.  Prior to Admission medications   Medication Sig Start Date End Date Taking? Authorizing Provider  albuterol  (VENTOLIN  HFA) 108 (90 Base) MCG/ACT inhaler Inhale 2 puffs into the lungs every 6 (six) hours as needed. 09/06/23     augmented betamethasone  dipropionate (DIPROLENE -AF) 0.05 % cream Apply a thin layer to rash twice daily for 1-2 weeks then stop. May repeat for rash flares 09/27/22     benzonatate  (TESSALON ) 100 MG capsule Take 1 capsule (100 mg total) by mouth every 8 (eight) hours. 04/08/22   Enedelia Dorna HERO, FNP  citalopram  (CELEXA ) 20  MG tablet Take 20 mg by mouth daily.  07/17/17   [provider]  citalopram  (CELEXA ) 20 MG tablet Take 1 tablet (20 mg total) by mouth daily. 02/23/22   Husain, Karrar, MD  citalopram  (CELEXA ) 20 MG tablet Take 1 tablet (20 mg total) by mouth daily. 02/07/23     cyclobenzaprine  (FLEXERIL ) 10 MG tablet Take 1 tablet (10 mg total) by mouth at bedtime as needed. 02/28/22   Husain, Karrar, MD   docusate sodium  (COLACE) 100 MG capsule Take 100 mg by mouth daily.    [provider]  ferrous sulfate  325 (65 FE) MG tablet Take 325 mg by mouth daily with breakfast.    [provider]  fluticasone  (FLONASE ) 50 MCG/ACT nasal spray Place 2 sprays into both nostrils daily.    [provider]  fluticasone  (FLONASE ) 50 MCG/ACT nasal spray Place 2 sprays into both nostrils daily. 03/17/22     fluticasone  (FLONASE ) 50 MCG/ACT nasal spray Place 2 sprays into both nostrils daily. 02/07/23     Homeopathic Products (ALLERGY MEDICINE PO) Take 1 tablet by mouth daily.    [provider]  hydrochlorothiazide  (HYDRODIURIL ) 25 MG tablet Take 25 mg by mouth daily. 07/22/17   [provider]  hydrochlorothiazide  (HYDRODIURIL ) 25 MG tablet Take 1 tablet (25 mg total) by mouth every morning. 02/23/22   Husain, Karrar, MD  hydrochlorothiazide  (HYDRODIURIL ) 25 MG tablet Take 1 tablet (25 mg total) by mouth in the morning. 01/16/23   Husain, Karrar, MD  hydrochlorothiazide  (HYDRODIURIL ) 25 MG tablet Take 1 tablet (25 mg total) by mouth in the morning. 04/26/23     ibuprofen  (ADVIL ) 800 MG tablet Take 1 tablet (800 mg total) by mouth every 8 (eight) hours as needed. 01/06/20   Taam-Akelman, Rosalea K, MD  levocetirizine (XYZAL ) 5 MG tablet Take 1 tablet (5 mg total) by mouth every evening. 02/23/22   Husain, Karrar, MD  levocetirizine (XYZAL ) 5 MG tablet Take 1 tablet (5 mg total) by mouth every evening. 02/07/23     levothyroxine  (SYNTHROID ) 100 MCG tablet Take 1 tablet (100 mcg total) by mouth in the morning on an empty stomach. 08/09/23     levothyroxine  (SYNTHROID ) 112 MCG tablet Take 112 mcg by mouth daily before breakfast.  05/20/17   [provider]  levothyroxine  (SYNTHROID ) 112 MCG tablet Take 1 tablet (112 mcg total) by mouth in the morning on an empty stomach 05/04/22   Husain, Karrar, MD  levothyroxine  (SYNTHROID ) 112 MCG tablet Take 1 tablet (112 mcg total) by  mouth in the morning on an empty stomach 02/07/23     levothyroxine  (SYNTHROID ) 88 MCG tablet Take 1 tablet (88 mcg total) by mouth every morning on an empty stomach for 30 days. 10/11/23     LOMAIRA  8 MG TABS Take 1 tablet by mouth every morning. 03/14/22   [provider]  loratadine  (CLARITIN ) 10 MG tablet Take 10 mg by mouth daily.    [provider]  metroNIDAZOLE  (METROGEL ) 0.75 % vaginal gel Insert 1 Applicatorful vaginally 2 (two) times a week. 08/30/22   Gretta Gums, MD  metroNIDAZOLE  (METROGEL ) 0.75 % vaginal gel Place 1 Application vaginally 2 (two) times a week. 12/13/22     omeprazole  (PRILOSEC) 20 MG capsule Take 20 mg by mouth daily. 06/17/17   [provider]  omeprazole  (PRILOSEC) 40 MG capsule Take 1 capsule (40 mg total) by mouth daily 1/2 to 1 hour before morning meal 08/08/23     oxyCODONE -acetaminophen  (PERCOCET) 5-325  MG tablet Take 1 tablet by mouth every 6 (six) hours as needed for severe pain. 01/06/20   Taam-Akelman, Rosalea K, MD  pantoprazole  (PROTONIX ) 20 MG tablet Take 1 tablet (20 mg total) by mouth daily. 10/09/22   Husain, Karrar, MD  pantoprazole  (PROTONIX ) 20 MG tablet Take 1 tablet (20 mg total) by mouth daily. 02/07/23     phenazopyridine  (PYRIDIUM ) 200 MG tablet Take 1 tablet (200 mg total) by mouth 3 (three) times daily. 06/14/21   Raspet, Erin K, PA-C  Phentermine  HCl (LOMAIRA ) 8 MG TABS Take 1 tablet (8 mg total) by mouth daily in the morning. 10/21/23     Phentermine  HCl (LOMAIRA ) 8 MG TABS Take 1 tablet by mouth once daily in the morning 02/20/24     Phentermine -Topiramate  (QSYMIA ) 7.5-46 MG CP24 Take 1 capsule by mouth daily. 01/30/23     predniSONE  (STERAPRED UNI-PAK 21 TAB) 10 MG (21) TBPK tablet Take as directed on package. 12/02/23     simethicone  (GAS-X) 80 MG chewable tablet Chew 1 tablet (80 mg total) by mouth every 6 (six) hours as needed for flatulence. 01/06/20   Taam-Akelman, Rosalea K, MD  topiramate  (TOPAMAX ) 25 MG tablet Take 25 mg  by mouth daily. 03/14/22   [provider]  topiramate  (TOPAMAX ) 25 MG tablet Take 1 tablet (25 mg total) by mouth daily. 06/05/23     topiramate  (TOPAMAX ) 25 MG tablet Take 1 tablet (25 mg total) by mouth daily. 07/11/23     topiramate  (TOPAMAX ) 25 MG tablet Take 1-2 tablets (25-50 mg total) by mouth daily. 09/11/23     topiramate  (TOPAMAX ) 25 MG tablet Take 1 tablet (25 mg total) by mouth daily. 02/20/24     triamcinolone  ointment (KENALOG ) 0.1 % Apply 1 Application topically 2 (two) times daily. 02/28/22   Husain, Karrar, MD  Vitamin D , Ergocalciferol , 50000 units CAPS Take 1 capsule by mouth once a week. 02/21/24       Physical Exam: Vitals:   04/05/24 1400 04/05/24 1723  BP: (!) 140/99 132/82  Pulse: 89 76  Resp: 16 16  Temp: 98.5 F (36.9 C) 98.3 F (36.8 C)  TempSrc:  Temporal  SpO2: 97% 100%    Physical Exam Constitutional:      General: He is not in acute distress.    Appearance: Normal appearance.  HENT:     Head: Normocephalic and atraumatic.  Eyes:     Extraocular Movements: Extraocular movements intact.     Conjunctiva/sclera: Conjunctivae normal.     Pupils: Pupils are equal, round, and reactive to light.  Cardiovascular:     Rate and Rhythm: Normal rate and regular rhythm.     Pulses: Normal pulses.     Heart sounds: Normal heart sounds.  Pulmonary:     Effort: Pulmonary effort is normal. No respiratory distress.     Breath sounds: Normal breath sounds. No wheezing, rhonchi or rales.  Abdominal:     General: Abdomen is flat. Bowel sounds are normal. There is no distension.     Palpations: Abdomen is soft.     Tenderness: There is no abdominal tenderness.  Musculoskeletal:        General: No deformity. Normal range of motion.  Skin:    General: Skin is warm and dry.     Coloration: Skin is not jaundiced.  Neurological:     General: No focal deficit present.     Mental Status: He is alert and oriented to person, place, and time. Mental status is  at  baseline.   Labs on Admission: I have personally reviewed following labs and imaging studies  CBC: Recent Labs  Lab 04/05/24 1421  WBC 4.2  HGB 11.9*  HCT 38.6  MCV 85.6  PLT 315   Basic Metabolic Panel: Recent Labs  Lab 04/05/24 1421  NA 136  K 3.2*  CL 102  CO2 19*  GLUCOSE 97  BUN 7  CREATININE 0.67  CALCIUM  9.0   GFR: CrCl cannot be calculated (Unknown ideal weight.). Liver Function Tests: No results for input(s): AST, ALT, ALKPHOS, BILITOT, PROT, ALBUMIN in the last 168 hours. No results for input(s): LIPASE, AMYLASE in the last 168 hours. No results for input(s): AMMONIA in the last 168 hours. Coagulation Profile: No results for input(s): INR, PROTIME in the last 168 hours. Cardiac Enzymes: No results for input(s): CKTOTAL, CKMB, CKMBINDEX, TROPONINI in the last 168 hours. BNP (last 3 results) No results for input(s): PROBNP in the last 8760 hours. HbA1C: No results for input(s): HGBA1C in the last 72 hours. CBG: No results for input(s): GLUCAP in the last 168 hours. Lipid Profile: No results for input(s): CHOL, HDL, LDLCALC, TRIG, CHOLHDL, LDLDIRECT in the last 72 hours. Thyroid  Function Tests: No results for input(s): TSH, T4TOTAL, FREET4, T3FREE, THYROIDAB in the last 72 hours. Anemia Panel: No results for input(s): VITAMINB12, FOLATE, FERRITIN, TIBC, IRON, RETICCTPCT in the last 72 hours. Urine analysis:    Component Value Date/Time   COLORURINE YELLOW 08/12/2017 1705   APPEARANCEUR CLEAR 08/12/2017 1705   LABSPEC <=1.005 06/14/2021 1950   PHURINE 6.0 06/14/2021 1950   GLUCOSEU NEGATIVE 06/14/2021 1950   HGBUR LARGE (A) 06/14/2021 1950   BILIRUBINUR NEGATIVE 06/14/2021 1950   KETONESUR NEGATIVE 06/14/2021 1950   PROTEINUR 30 (A) 06/14/2021 1950   UROBILINOGEN 0.2 06/14/2021 1950   NITRITE NEGATIVE 06/14/2021 1950   LEUKOCYTESUR SMALL (A) 06/14/2021 1950    Radiological  Exams on Admission: DG Chest 2 View Result Date: 04/05/2024 CLINICAL DATA:  Chest and left arm pain. History of anemia and hypertension. EXAM: CHEST - 2 VIEW COMPARISON:  None Available. FINDINGS: The heart size and mediastinal contours are within normal limits. Atelectasis or scarring is noted at the lung bases. No effusion or pneumothorax is seen. No acute osseous abnormality. IMPRESSION: No active cardiopulmonary disease. Electronically Signed   By: Leita Birmingham M.D.   On: 04/05/2024 15:22    EKG: Independently reviewed.   Assessment/Plan Principal Problem:   NSTEMI (non-ST elevated myocardial infarction) (HCC) Active Problems:   HLD (hyperlipidemia)   Essential hypertension   Asthma, mild intermittent   Hypothyroidism   Prolonged Q-T interval on ECG    NSTEMI Will start on heparin  drip Continue beta blocker at this time Patient has received aspirin  325 mg in the ER Will continue aspirin  81 mg daily Will continue atorvastatin  as well Cardiology will be consulted Will obtain echocardiogram at this time Will start the patient on nitroglycerin  SL Will also give morphine  for pain control as well   HLD Patient will be on lipitor 40 mg daily   Essential hypertension Patient states that she was taking hydrochlorothiazide  for blood pressure at home May started on low dose metoprolol  12.5 mg BID  Mild intermittent asthma Patient is currently on albuterol  inhalers as needed  Hypothyroidism Will obtain TSH Continue levothyroxine  88 mcg at this time  Prolonged QTC Will avoid Qtc prolonging medications  DVT prophylaxis: heparin   Code Status: full  Family Communication:     Renny Recardo HERO (Mother) [phone] (  Home Phone)   Disposition Plan:  Patient class is not currently inpatient or observation. Update patient class prior to completing documentation    Consults called: cardiology  Admission status: inpatient Level of care: Level of care:  The medical decision  making on this patient was of high complexity and the patient is at high risk for clinical deterioration, therefore this is a level 3 visit.  The medical decision making is of moderate complexity, therefore this is a level 2 visit.  Bradly MARLA Drones MD [facility]  If 7PM-7AM, please contact night-coverage [email or web address]  04/05/2024, 7:19 PM

## 2024-04-05 NOTE — ED Provider Triage Note (Signed)
 Emergency Medicine Provider Triage Evaluation Note  Samantha Robinson , a 48 y.o. female  was evaluated in triage.  Pt complains of cp. Endorse pain across chest, radiates to L arm with tingling/numbness sensation to L arm for the past hr.  No fever, chills, sob, n/v, weakness, abd pain or back pain.  No hx of prior stroke or cardiac disease.    Review of Systems  Positive: As above Negative: As above  Physical Exam  BP (!) 140/99   Pulse 89   Temp 98.5 F (36.9 C)   Resp 16   LMP 09/23/2019 (Approximate)   SpO2 97%  Gen:   Awake, no distress   Resp:  Normal effort  MSK:   Moves extremities without difficulty  Other:  Neurologic exam:  Speech clear, pupils equal round reactive to light, extraocular movements intact Normal peripheral visual fields Cranial nerves III through XII normal including no facial droop Follows commands, moves all extremities x4, normal strength to bilateral upper and lower extremities at all major muscle groups including grip Sensation normal to light touch  Coordination intact, no limb ataxia, finger-nose-finger normal Rapid alternating movements normal No pronator drift Gait normal   Medical Decision Making  Medically screening exam initiated at 2:28 PM.  Appropriate orders placed.  Samantha Robinson was informed that the remainder of the evaluation will be completed by another provider, this initial triage assessment does not replace that evaluation, and the importance of remaining in the ED until their evaluation is complete.  No neuro deficit   Nivia Colon, PA-C 04/05/24 1430

## 2024-04-05 NOTE — ED Triage Notes (Signed)
 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 Symptoms started about 1:30pm. She denies nausea or vomiting. Denies cardaic history.

## 2024-04-05 NOTE — ED Provider Notes (Incomplete)
 Patient is a pleasant 48 year old female presenting today with complaints of chest pain that radiated down her left arm that started while she was at work today getting ready for her day.  She reports symptoms lasted for several hours.  There was a point in time where she felt like her heart might have been fluttering a little bit but no significant shortness of breath or pleuritic pain.  She reports now her chest just feels a little bit sore but she can feel overall feels better.  Denies prior history of anything similar.  No recent medication changes she does not take OCPs and has no family history of blood clots or DVT.  She has not had any unilateral leg pain or swelling.  She her father did have heart attacks and both parents had strokes.  She denies ever having symptoms like this in the past and had been feeling well prior to work.  Initial troponin is normal, EKG without acute findings, mild hypokalemia here most likely because prior HCTZ.  Delta troponin

## 2024-04-05 NOTE — ED Notes (Signed)
 CCMD notified of monitoring need

## 2024-04-05 NOTE — ED Provider Notes (Signed)
 [place] EMERGENCY DEPARTMENT AT [facility] Provider Note   CSN: [id number] Arrival date & time: 04/05/24  1349     Patient presents with: No chief complaint on file.   Samantha Robinson is a 48 y.o. female. Hx of anemia, anxiety, GERD, HTN, hypothyroidism presenting with CP starting 1:30 pm today.  History per patient.  She reports arriving to work around 1-1 15, while starting her job where she was on her feet and working to catch up on things, she suddenly began feeling chest pain with radiating symptoms down the left arm.  She went outside to try and take a deep breath, but felt that the symptoms were not improving, therefore came back inside, and a coworker called EMS.  She did not have syncope or near syncope associated.  With EMS, she was not given any medications, but felt much improved while lying down, feels much better after arriving to the ED.  She endorses that all her symptoms have completely resolved at this time.  She denies nausea or vomiting.  Endorses being compliant with her medications.  Denies fever chills, recent illnesses.  {Add pertinent medical, surgical, social history, OB history to HPI:32947} HPI     Prior to Admission medications   Medication Sig Start Date End Date Taking? Authorizing Provider  albuterol  (VENTOLIN  HFA) 108 (90 Base) MCG/ACT inhaler Inhale 2 puffs into the lungs every 6 (six) hours as needed. 09/06/23     augmented betamethasone  dipropionate (DIPROLENE -AF) 0.05 % cream Apply a thin layer to rash twice daily for 1-2 weeks then stop. May repeat for rash flares 09/27/22     benzonatate  (TESSALON ) 100 MG capsule Take 1 capsule (100 mg total) by mouth every 8 (eight) hours. 04/08/22   Enedelia Dorna HERO, FNP  citalopram  (CELEXA ) 20 MG tablet Take 20 mg by mouth daily.  07/17/17   [provider]  citalopram  (CELEXA ) 20 MG tablet Take 1 tablet (20 mg total) by mouth daily. 02/23/22   Husain, Karrar, MD  citalopram  (CELEXA ) 20 MG  tablet Take 1 tablet (20 mg total) by mouth daily. 02/07/23     cyclobenzaprine  (FLEXERIL ) 10 MG tablet Take 1 tablet (10 mg total) by mouth at bedtime as needed. 02/28/22   Husain, Karrar, MD  docusate sodium  (COLACE) 100 MG capsule Take 100 mg by mouth daily.    [provider]  ferrous sulfate  325 (65 FE) MG tablet Take 325 mg by mouth daily with breakfast.    [provider]  fluticasone  (FLONASE ) 50 MCG/ACT nasal spray Place 2 sprays into both nostrils daily.    [provider]  fluticasone  (FLONASE ) 50 MCG/ACT nasal spray Place 2 sprays into both nostrils daily. 03/17/22     fluticasone  (FLONASE ) 50 MCG/ACT nasal spray Place 2 sprays into both nostrils daily. 02/07/23     Homeopathic Products (ALLERGY MEDICINE PO) Take 1 tablet by mouth daily.    [provider]  hydrochlorothiazide  (HYDRODIURIL ) 25 MG tablet Take 25 mg by mouth daily. 07/22/17   [provider]  hydrochlorothiazide  (HYDRODIURIL ) 25 MG tablet Take 1 tablet (25 mg total) by mouth every morning. 02/23/22   Husain, Karrar, MD  hydrochlorothiazide  (HYDRODIURIL ) 25 MG tablet Take 1 tablet (25 mg total) by mouth in the morning. 01/16/23   Husain, Karrar, MD  hydrochlorothiazide  (HYDRODIURIL ) 25 MG tablet Take 1 tablet (25 mg total) by mouth in the morning. 04/26/23     ibuprofen  (ADVIL ) 800 MG tablet Take 1 tablet (800 mg total)  by mouth every 8 (eight) hours as needed. 01/06/20   Taam-Akelman, Rosalea K, MD  levocetirizine (XYZAL ) 5 MG tablet Take 1 tablet (5 mg total) by mouth every evening. 02/23/22   Husain, Karrar, MD  levocetirizine (XYZAL ) 5 MG tablet Take 1 tablet (5 mg total) by mouth every evening. 02/07/23     levothyroxine  (SYNTHROID ) 100 MCG tablet Take 1 tablet (100 mcg total) by mouth in the morning on an empty stomach. 08/09/23     levothyroxine  (SYNTHROID ) 112 MCG tablet Take 112 mcg by mouth daily before breakfast.  05/20/17   [provider]  levothyroxine  (SYNTHROID ) 112  MCG tablet Take 1 tablet (112 mcg total) by mouth in the morning on an empty stomach 05/04/22   Husain, Karrar, MD  levothyroxine  (SYNTHROID ) 112 MCG tablet Take 1 tablet (112 mcg total) by mouth in the morning on an empty stomach 02/07/23     levothyroxine  (SYNTHROID ) 88 MCG tablet Take 1 tablet (88 mcg total) by mouth every morning on an empty stomach for 30 days. 10/11/23     LOMAIRA  8 MG TABS Take 1 tablet by mouth every morning. 03/14/22   [provider]  loratadine  (CLARITIN ) 10 MG tablet Take 10 mg by mouth daily.    [provider]  metroNIDAZOLE  (METROGEL ) 0.75 % vaginal gel Insert 1 Applicatorful vaginally 2 (two) times a week. 08/30/22   Gretta Gums, MD  metroNIDAZOLE  (METROGEL ) 0.75 % vaginal gel Place 1 Application vaginally 2 (two) times a week. 12/13/22     omeprazole  (PRILOSEC) 20 MG capsule Take 20 mg by mouth daily. 06/17/17   [provider]  omeprazole  (PRILOSEC) 40 MG capsule Take 1 capsule (40 mg total) by mouth daily 1/2 to 1 hour before morning meal 08/08/23     oxyCODONE -acetaminophen  (PERCOCET) 5-325 MG tablet Take 1 tablet by mouth every 6 (six) hours as needed for severe pain. 01/06/20   Taam-Akelman, Rosalea K, MD  pantoprazole  (PROTONIX ) 20 MG tablet Take 1 tablet (20 mg total) by mouth daily. 10/09/22   Husain, Karrar, MD  pantoprazole  (PROTONIX ) 20 MG tablet Take 1 tablet (20 mg total) by mouth daily. 02/07/23     phenazopyridine  (PYRIDIUM ) 200 MG tablet Take 1 tablet (200 mg total) by mouth 3 (three) times daily. 06/14/21   Raspet, Erin K, PA-C  Phentermine  HCl (LOMAIRA ) 8 MG TABS Take 1 tablet (8 mg total) by mouth daily in the morning. 10/21/23     Phentermine  HCl (LOMAIRA ) 8 MG TABS Take 1 tablet by mouth once daily in the morning 02/20/24     Phentermine -Topiramate  (QSYMIA ) 7.5-46 MG CP24 Take 1 capsule by mouth daily. 01/30/23     predniSONE  (STERAPRED UNI-PAK 21 TAB) 10 MG (21) TBPK tablet Take as directed on package. 12/02/23     simethicone  (GAS-X)  80 MG chewable tablet Chew 1 tablet (80 mg total) by mouth every 6 (six) hours as needed for flatulence. 01/06/20   Taam-Akelman, Rosalea K, MD  topiramate  (TOPAMAX ) 25 MG tablet Take 25 mg by mouth daily. 03/14/22   [provider]  topiramate  (TOPAMAX ) 25 MG tablet Take 1 tablet (25 mg total) by mouth daily. 06/05/23     topiramate  (TOPAMAX ) 25 MG tablet Take 1 tablet (25 mg total) by mouth daily. 07/11/23     topiramate  (TOPAMAX ) 25 MG tablet Take 1-2 tablets (25-50 mg total) by mouth daily. 09/11/23     topiramate  (TOPAMAX ) 25 MG tablet Take 1 tablet (25 mg total) by mouth daily. 02/20/24  triamcinolone  ointment (KENALOG ) 0.1 % Apply 1 Application topically 2 (two) times daily. 02/28/22   Husain, Karrar, MD  Vitamin D , Ergocalciferol , 50000 units CAPS Take 1 capsule by mouth once a week. 02/21/24       Allergies: Penicillins    Review of Systems  Updated Vital Signs BP (!) 140/99   Pulse 89   Temp 98.5 F (36.9 C)   Resp 16   LMP 09/23/2019 (Approximate)   SpO2 97%   Physical Exam Vitals and nursing note reviewed.  Constitutional:      General: She is not in acute distress.    Appearance: She is well-developed.  HENT:     Head: Normocephalic and atraumatic.  Eyes:     Conjunctiva/sclera: Conjunctivae normal.  Cardiovascular:     Rate and Rhythm: Normal rate and regular rhythm.     Heart sounds: No murmur heard. Pulmonary:     Effort: Pulmonary effort is normal. No respiratory distress.     Breath sounds: Normal breath sounds.  Abdominal:     Palpations: Abdomen is soft.     Tenderness: There is no abdominal tenderness.  Musculoskeletal:        General: No swelling.     Cervical back: Neck supple.  Skin:    General: Skin is warm and dry.     Capillary Refill: Capillary refill takes less than 2 seconds.  Neurological:     General: No focal deficit present.     Mental Status: She is alert and oriented to person, place, and time.  Psychiatric:        Mood and  Affect: Mood normal.     (all labs ordered are listed, but only abnormal results are displayed) Labs Reviewed  BASIC METABOLIC PANEL WITH GFR - Abnormal; Notable for the following components:      Result Value   Potassium 3.2 (*)    CO2 19 (*)    All other components within normal limits  CBC - Abnormal; Notable for the following components:   Hemoglobin 11.9 (*)    All other components within normal limits  TROPONIN I (HIGH SENSITIVITY)  TROPONIN I (HIGH SENSITIVITY)    EKG: None  Radiology: DG Chest 2 View Result Date: 04/05/2024 CLINICAL DATA:  Chest and left arm pain. History of anemia and hypertension. EXAM: CHEST - 2 VIEW COMPARISON:  None Available. FINDINGS: The heart size and mediastinal contours are within normal limits. Atelectasis or scarring is noted at the lung bases. No effusion or pneumothorax is seen. No acute osseous abnormality. IMPRESSION: No active cardiopulmonary disease. Electronically Signed   By: Leita Birmingham M.D.   On: 04/05/2024 15:22    {Document cardiac monitor, telemetry assessment procedure when appropriate:32947} Procedures   Medications Ordered in the ED - No data to display  Clinical Course as of 04/05/24 1756  Sun Apr 05, 2024  1752 HEART score 3 [BS]    Clinical Course User Index [BS] Arlee Katz, MD   {Click here for ABCD2, HEART and other calculators REFRESH Note before signing:1}                              Medical Decision Making Amount and/or Complexity of Data Reviewed Labs: ordered. Radiology: ordered.  Risk Prescription drug management.   ***  {Document critical care time when appropriate  Document review of labs and clinical decision tools ie CHADS2VASC2, etc  Document your independent review of radiology images and  any outside records  Document your discussion with family members, caretakers and with consultants  Document social determinants of health affecting pt's care  Document your decision making why  or why not admission, treatments were needed:32947:::1}   Final diagnoses:  None    ED Discharge Orders     None

## 2024-04-05 NOTE — Progress Notes (Signed)
 ANTICOAGULATION CONSULT NOTE  Pharmacy Consult for Heparin  Indication: chest pain/ACS  Allergies  Allergen Reactions   Penicillins Nausea Only    Has patient had a PCN reaction causing immediate rash, facial/tongue/throat swelling, SOB or lightheadedness with hypotension: No Has patient had a PCN reaction causing severe rash involving mucus membranes or skin necrosis: No Has patient had a PCN reaction that required hospitalization: No Has patient had a PCN reaction occurring within the last 10 years: No If all of the above answers are NO, then may proceed with Cephalosporin use.    Patient Measurements:   Heparin  Dosing Weight: ~71 kg  Vital Signs: Temp: 98.3 F (36.8 C) (11/23 1723) Temp Source: Temporal (11/23 1723) BP: 132/82 (11/23 1723) Pulse Rate: 76 (11/23 1723)  Labs: Recent Labs    04/05/24 1421 04/05/24 1738  HGB 11.9*  --   HCT 38.6  --   PLT 315  --   CREATININE 0.67  --   TROPONINIHS 3 69*    CrCl cannot be calculated (Unknown ideal weight.).   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 Patient is presenting with chest pain and left arm numbness. Heparin  per pharmacy consult placed for chest pain/ACS.  Patient is not on anticoagulation prior to arrival.  Hgb 11.9; plt 315  Goal of Therapy:  Heparin  level 0.3-0.7 units/ml Monitor platelets by anticoagulation protocol: Yes   Plan:  Give IV heparin   4000 units bolus x 1 Start heparin  infusion at 850 units/hr Check anti-Xa level in 6 hours and daily while on heparin  Continue to monitor H&H and platelets  Dorn Buttner, PharmD, BCPS 04/05/2024 7:36 PM ED Clinical Pharmacist -  [phone]

## 2024-04-06 ENCOUNTER — Inpatient Hospital Stay (HOSPITAL_COMMUNITY)

## 2024-04-06 ENCOUNTER — Encounter (HOSPITAL_COMMUNITY): Payer: Self-pay

## 2024-04-06 ENCOUNTER — Encounter (HOSPITAL_COMMUNITY)
Admission: EM | Disposition: A | Discharge status: Home / Self Care | Payer: Self-pay | Source: Home / Self Care | Attending: Family Medicine

## 2024-04-06 DIAGNOSIS — I2510 Atherosclerotic heart disease of native coronary artery without angina pectoris: Secondary | ICD-10-CM

## 2024-04-06 DIAGNOSIS — I10 Essential (primary) hypertension: Secondary | ICD-10-CM | POA: Diagnosis not present

## 2024-04-06 DIAGNOSIS — E782 Mixed hyperlipidemia: Secondary | ICD-10-CM

## 2024-04-06 DIAGNOSIS — I409 Acute myocarditis, unspecified: Secondary | ICD-10-CM | POA: Diagnosis not present

## 2024-04-06 DIAGNOSIS — R079 Chest pain, unspecified: Secondary | ICD-10-CM | POA: Diagnosis not present

## 2024-04-06 DIAGNOSIS — I214 Non-ST elevation (NSTEMI) myocardial infarction: Secondary | ICD-10-CM | POA: Diagnosis not present

## 2024-04-06 HISTORY — PX: LEFT HEART CATH AND CORONARY ANGIOGRAPHY: CATH118249

## 2024-04-06 LAB — CBC WITH DIFFERENTIAL/PLATELET
Abs Immature Granulocytes: 0.01 K/uL (ref 0.00–0.07)
Basophils Absolute: 0 K/uL (ref 0.0–0.1)
Basophils Relative: 1 %
Eosinophils Absolute: 0.1 K/uL (ref 0.0–0.5)
Eosinophils Relative: 2 %
HCT: 39.5 % (ref 36.0–46.0)
Hemoglobin: 12.3 g/dL (ref 12.0–15.0)
Immature Granulocytes: 0 %
Lymphocytes Relative: 36 %
Lymphs Abs: 1.4 K/uL (ref 0.7–4.0)
MCH: 26.6 pg (ref 26.0–34.0)
MCHC: 31.1 g/dL (ref 30.0–36.0)
MCV: 85.5 fL (ref 80.0–100.0)
Monocytes Absolute: 0.5 K/uL (ref 0.1–1.0)
Monocytes Relative: 12 %
Neutro Abs: 1.9 K/uL (ref 1.7–7.7)
Neutrophils Relative %: 49 %
Platelets: 289 K/uL (ref 150–400)
RBC: 4.62 MIL/uL (ref 3.87–5.11)
RDW: 12.5 % (ref 11.5–15.5)
WBC: 3.8 K/uL — ABNORMAL LOW (ref 4.0–10.5)
nRBC: 0 % (ref 0.0–0.2)

## 2024-04-06 LAB — ECHOCARDIOGRAM COMPLETE
Area-P 1/2: 4.33 cm2
Calc EF: 51.8 %
Height: 61 in
S' Lateral: 3 cm
Single Plane A2C EF: 47.6 %
Single Plane A4C EF: 50.6 %
Weight: 3446.23 [oz_av]

## 2024-04-06 LAB — COMPREHENSIVE METABOLIC PANEL WITH GFR
ALT: 17 U/L (ref 0–44)
AST: 26 U/L (ref 15–41)
Albumin: 3.3 g/dL — ABNORMAL LOW (ref 3.5–5.0)
Alkaline Phosphatase: 35 U/L — ABNORMAL LOW (ref 38–126)
Anion gap: 5 (ref 5–15)
BUN: 7 mg/dL (ref 6–20)
CO2: 29 mmol/L (ref 22–32)
Calcium: 8.9 mg/dL (ref 8.9–10.3)
Chloride: 101 mmol/L (ref 98–111)
Creatinine, Ser: 0.62 mg/dL (ref 0.44–1.00)
GFR, Estimated: >60 mL/min (ref >60)
Glucose, Bld: 95 mg/dL (ref 70–99)
Potassium: 3.7 mmol/L (ref 3.5–5.1)
Sodium: 135 mmol/L (ref 135–145)
Total Bilirubin: 0.9 mg/dL (ref 0.0–1.2)
Total Protein: 6.9 g/dL (ref 6.5–8.1)

## 2024-04-06 LAB — HEPARIN LEVEL (UNFRACTIONATED): Heparin Unfractionated: 0.33 [IU]/mL (ref 0.30–0.70)

## 2024-04-06 LAB — MAGNESIUM: Magnesium: 2.1 mg/dL (ref 1.7–2.4)

## 2024-04-06 LAB — TROPONIN I (HIGH SENSITIVITY): Troponin I (High Sensitivity): 1215 ng/L (ref <18)

## 2024-04-06 MED ORDER — HYDRALAZINE HCL 20 MG/ML IJ SOLN: 10.0000 mg | INTRAMUSCULAR | Status: AC | PRN

## 2024-04-06 MED ORDER — FENTANYL CITRATE (PF) 100 MCG/2ML IJ SOLN
INTRAMUSCULAR | Status: DC | PRN
  Administered 2024-04-06: 25 ug via INTRAVENOUS

## 2024-04-06 MED ORDER — VERAPAMIL HCL 2.5 MG/ML IV SOLN
INTRAVENOUS | Status: DC | PRN
  Administered 2024-04-06: 10 mL via INTRA_ARTERIAL

## 2024-04-06 MED ORDER — SODIUM CHLORIDE 0.9% FLUSH: 3.0000 mL | INTRAVENOUS | Status: DC | PRN

## 2024-04-06 MED ORDER — SODIUM CHLORIDE 0.9 % IV SOLN
INTRAVENOUS | Status: DC | PRN
  Administered 2024-04-06: 10 mL/h via INTRAVENOUS

## 2024-04-06 MED ORDER — SODIUM CHLORIDE 0.9% FLUSH
3.0000 mL | Freq: Two times a day (BID) | INTRAVENOUS | Status: DC
  Administered 2024-04-06 – 2024-04-07 (×3): 3 mL via INTRAVENOUS

## 2024-04-06 MED ORDER — IOHEXOL 350 MG/ML SOLN
INTRAVENOUS | Status: DC | PRN
  Administered 2024-04-06: 40 mL

## 2024-04-06 MED ORDER — LIDOCAINE HCL (PF) 1 % IJ SOLN
INTRAMUSCULAR | Status: AC
Start: 2024-04-06 — End: 2024-04-06
  Filled 2024-04-06: qty 30

## 2024-04-06 MED ORDER — FREE WATER
250.0000 mL | Freq: Once | Status: AC
  Administered 2024-04-06: 250 mL via ORAL

## 2024-04-06 MED ORDER — MIDAZOLAM HCL 2 MG/2ML IJ SOLN
INTRAMUSCULAR | Status: AC
  Filled 2024-04-06: qty 2

## 2024-04-06 MED ORDER — FREE WATER: 500.0000 mL | Freq: Once | Status: DC

## 2024-04-06 MED ORDER — SODIUM CHLORIDE 0.9 % IV SOLN: 250.0000 mL | INTRAVENOUS | Status: AC | PRN

## 2024-04-06 MED ORDER — GADOBUTROL 1 MMOL/ML IV SOLN
10.0000 mL | Freq: Once | INTRAVENOUS | Status: AC | PRN
  Administered 2024-04-06: 10 mL via INTRAVENOUS

## 2024-04-06 MED ORDER — VERAPAMIL HCL 2.5 MG/ML IV SOLN
INTRAVENOUS | Status: AC
Start: 2024-04-06 — End: 2024-04-06
  Filled 2024-04-06: qty 2

## 2024-04-06 MED ORDER — HEPARIN (PORCINE) IN NACL 1000-0.9 UT/500ML-% IV SOLN
INTRAVENOUS | Status: DC | PRN
Start: 2024-04-06 — End: 2024-04-06
  Administered 2024-04-06 (×2): 500 mL

## 2024-04-06 MED ORDER — HEPARIN SODIUM (PORCINE) 1000 UNIT/ML IJ SOLN
INTRAMUSCULAR | Status: DC | PRN
  Administered 2024-04-06: 5000 [IU] via INTRAVENOUS

## 2024-04-06 MED ORDER — HEPARIN SODIUM (PORCINE) 1000 UNIT/ML IJ SOLN
INTRAMUSCULAR | Status: AC
  Filled 2024-04-06: qty 10

## 2024-04-06 MED ORDER — LABETALOL HCL 5 MG/ML IV SOLN: 10.0000 mg | INTRAVENOUS | Status: AC | PRN

## 2024-04-06 MED ORDER — ASPIRIN 81 MG PO CHEW: 81.0000 mg | CHEWABLE_TABLET | ORAL | Status: DC

## 2024-04-06 MED ORDER — MIDAZOLAM HCL (PF) 2 MG/2ML IJ SOLN
INTRAMUSCULAR | Status: DC | PRN
  Administered 2024-04-06: 1 mg via INTRAVENOUS

## 2024-04-06 MED ORDER — LIDOCAINE HCL (PF) 1 % IJ SOLN
INTRAMUSCULAR | Status: DC | PRN
  Administered 2024-04-06: 2 mL via INTRADERMAL

## 2024-04-06 MED ORDER — FENTANYL CITRATE (PF) 100 MCG/2ML IJ SOLN
INTRAMUSCULAR | Status: AC
  Filled 2024-04-06: qty 2

## 2024-04-06 MED ORDER — ENOXAPARIN SODIUM 40 MG/0.4ML IJ SOSY
40.0000 mg | PREFILLED_SYRINGE | INTRAMUSCULAR | Status: DC
  Administered 2024-04-07: 40 mg via SUBCUTANEOUS
  Filled 2024-04-06: qty 0.4

## 2024-04-06 NOTE — Progress Notes (Signed)
 PROGRESS NOTE    Samantha Robinson  FMW:[id number] DOB: 1976/05/12 DOA: 04/05/2024 PCP: Ransom Other, MD   Brief Narrative:  HPI: Samantha Robinson is a 48 y.o. female with medical history significant of hypothyroidism, hypertension, hyperlipidemia, GERD, morbid obesity class III, and asthma presented to hospital complaint of chest pain.  Patient states that she got into work here at [employer] around 1 PM this afternoon.  After getting in she said that she started to experience pressure-like chest pain midsternal region 7 8 out of 10 in intensity and associated with left arm numbness and tingling.  Some shortness of breath no diaphoresis.  Said she took a couple deep breaths but symptoms were not improving and therefore she decided to come to the emergency room for evaluation and treatment.   ED Course:  In the ER, BP 132/82, HR 76, R 16 R, O2 saturation 100% on room air,  and Tmax 98.5. Cbc demonstrated wbc,  hb/hct, and platelet. Chemistry demonstrated Na 136, K 3.2, Cl 102, bicarb 19, Bun/Cr 7/0.67 and glucose 97. Anion gap 15. Initial troponin was 3 and repeat was 69.  CXR demonstrated no acute cardiopulmonary findings. Urinalysis demonstrated. EKG demonstrated normal sinus rhythm prolonged QT with inverted t wave in anterolateral leads  Assessment & Plan:   Principal Problem:   NSTEMI (non-ST elevated myocardial infarction) (HCC) Active Problems:   HLD (hyperlipidemia)   Essential hypertension   Asthma, mild intermittent   Hypothyroidism   Prolonged Q-T interval on ECG  NSTEMI: Troponin delta impressively positive started with 3 continued to rise and currently 1215.  Patient was given aspirin  and Plavix , started on heparin .  Cardiology on board.  Patient is scheduled for catheter today.  Essential hypertension: Was on hydrochlorothiazide  at home but has been started on Lopressor  12.5 p.o. twice daily.  Mild intermittent asthma: Asymptomatic.  Continue albuterol  inhalers as  needed.  Acquired hypothyroidism: Continue Synthroid .  TSH pending.  Hyperlipidemia: Continue atorvastatin .  Prolonged QTc: Monitor closely and avoid QTc prolonging medications.  DVT prophylaxis: Heparin    Code Status: Full Code  Family Communication:  None present at bedside.  Plan of care discussed with patient in length and he/she verbalized understanding and agreed with it.  Status is: Inpatient Remains inpatient appropriate because: Scheduled for cath today.   Estimated body mass index is 40.7 kg/m as calculated from the following:   Height as of this encounter: 5' 1 (1.549 m).   Weight as of this encounter: 97.7 kg.    Nutritional Assessment: Body mass index is 40.7 kg/m.Samantha Robinson Seen by dietician.  I agree with the assessment and plan as outlined below: Nutrition Status:        . Skin Assessment: I have examined the patient's skin and I agree with the wound assessment as performed by the wound care RN as outlined below:    Consultants:  Cardiology  Procedures:  As above  Antimicrobials:  Anti-infectives (From admission, onward)    None         Subjective: Seen and examined the ED, her symptoms have resolved, no chest pain or shortness of breath.  Objective: Vitals:   04/06/24 0030 04/06/24 0031 04/06/24 0605 04/06/24 0606  BP:  120/76  (!) 146/71  Pulse:  80  74  Resp:  20  16  Temp: 98.2 F (36.8 C) 98.2 F (36.8 C)  97.6 F (36.4 C)  TempSrc: Oral Oral  Oral  SpO2:  100%  100%  Weight:   97.7 kg  Height:   5' 1 (1.549 m)    No intake or output data in the 24 hours ending 04/06/24 0743 Filed Weights   04/05/24 2034 04/06/24 0605  Weight: 97.1 kg 97.7 kg    Examination:  General exam: Appears calm and comfortable  Respiratory system: Clear to auscultation. Respiratory effort normal. Cardiovascular system: S1 & S2 heard, RRR. No JVD, murmurs, rubs, gallops or clicks. No pedal edema. Gastrointestinal system: Abdomen is nondistended,  soft and nontender. No organomegaly or masses felt. Normal bowel sounds heard. Central nervous system: Alert and oriented. No focal neurological deficits. Extremities: Symmetric 5 x 5 power. Skin: No rashes, lesions or ulcers Psychiatry: Judgement and insight appear normal. Mood & affect appropriate.    Data Reviewed: I have personally reviewed following labs and imaging studies  CBC: Recent Labs  Lab 04/05/24 1421 04/06/24 0612  WBC 4.2 3.8*  NEUTROABS  --  1.9  HGB 11.9* 12.3  HCT 38.6 39.5  MCV 85.6 85.5  PLT 315 289   Basic Metabolic Panel: Recent Labs  Lab 04/05/24 1421 04/05/24 2012 04/06/24 0233  NA 136  --  135  K 3.2*  --  3.7  CL 102  --  101  CO2 19*  --  29  GLUCOSE 97  --  95  BUN 7  --  7  CREATININE 0.67  --  0.62  CALCIUM  9.0  --  8.9  MG  --  2.1 2.1   GFR: Estimated Creatinine Clearance: 92 mL/min (by C-G formula based on SCr of 0.62 mg/dL). Liver Function Tests: Recent Labs  Lab 04/06/24 0233  AST 26  ALT 17  ALKPHOS 35*  BILITOT 0.9  PROT 6.9  ALBUMIN 3.3*   No results for input(s): LIPASE, AMYLASE in the last 168 hours. No results for input(s): AMMONIA in the last 168 hours. Coagulation Profile: No results for input(s): INR, PROTIME in the last 168 hours. Cardiac Enzymes: No results for input(s): CKTOTAL, CKMB, CKMBINDEX, TROPONINI in the last 168 hours. BNP (last 3 results) No results for input(s): PROBNP in the last 8760 hours. HbA1C: Recent Labs    04/05/24 2012  HGBA1C 4.0*   CBG: No results for input(s): GLUCAP in the last 168 hours. Lipid Profile: Recent Labs    04/05/24 2012  CHOL 170  HDL 76  LDLCALC 79  TRIG 74  CHOLHDL 2.2   Thyroid  Function Tests: No results for input(s): TSH, T4TOTAL, FREET4, T3FREE, THYROIDAB in the last 72 hours. Anemia Panel: No results for input(s): VITAMINB12, FOLATE, FERRITIN, TIBC, IRON, RETICCTPCT in the last 72 hours. Sepsis Labs: No  results for input(s): PROCALCITON, LATICACIDVEN in the last 168 hours.  No results found for this or any previous visit (from the past 240 hours).   Radiology Studies: DG Chest 2 View Result Date: 04/05/2024 CLINICAL DATA:  Chest and left arm pain. History of anemia and hypertension. EXAM: CHEST - 2 VIEW COMPARISON:  None Available. FINDINGS: The heart size and mediastinal contours are within normal limits. Atelectasis or scarring is noted at the lung bases. No effusion or pneumothorax is seen. No acute osseous abnormality. IMPRESSION: No active cardiopulmonary disease. Electronically Signed   By: Leita Birmingham M.D.   On: 04/05/2024 15:22    Scheduled Meds:  aspirin  EC  81 mg Oral Daily   atorvastatin   40 mg Oral Daily   clopidogrel   75 mg Oral Daily   ferrous sulfate   325 mg Oral Q breakfast   levothyroxine   88 mcg  Oral Q0600   loratadine   10 mg Oral Daily   metoprolol  tartrate  12.5 mg Oral BID   pantoprazole   40 mg Oral Daily   topiramate   25 mg Oral Daily   Continuous Infusions:  heparin  850 Units/hr (04/05/24 2039)     LOS: 1 day   Fredia Skeeter, MD [facility]  04/06/2024, 7:43 AM   *Please note that this is a verbal dictation therefore any spelling or grammatical errors are due to the Dragon Medical One system interpretation.  Please page via Amion and do not message via secure chat for urgent patient care matters. Secure chat can be used for non urgent patient care matters.  How to contact the [facility] Attending or Consulting provider 7A - 7P or covering provider during after hours 7P -7A, for this patient?  Check the care team in [facility] and look for a) attending/consulting [facility] provider listed and b) the [facility] team listed. Page or secure chat 7A-7P. Log into [email or web address] and use [place]'s universal password to access. If you do not have the password, please contact the hospital operator. Locate the [facility] provider you are looking for under [facility] and  page to a number that you can be directly reached. If you still have difficulty reaching the provider, please page the [facility] (Director on Call) for the Hospitalists listed on amion for assistance.

## 2024-04-06 NOTE — Progress Notes (Signed)
  Echocardiogram 2D Echocardiogram has been performed.  Norleen ORN [facility] 04/06/2024, 9:52 AM

## 2024-04-06 NOTE — ED Notes (Signed)
 Pulled patients pain medication PO for c/o of pain. Once I scanned, placed in cup, before she took it, I found out she will be going to cath lab. So I will waste in pyxis with another Nurse, and get IV pain meds.

## 2024-04-06 NOTE — H&P (View-Only) (Signed)
 Cardiology Consultation   Patient ID: ANNALIS Robinson MRN: [id number]; DOB: 1975/09/11  Admit date: 04/05/2024 Date of Consult: 04/06/2024  PCP:  Ransom Other, MD   [facility] Providers Cardiologist:  None   Patient Profile: Samantha Robinson is a 48 y.o. female with a hx of hypertension, hypothyroidism who is being seen 04/06/2024 for the evaluation of NSTEMI at the request of Dr. Lorren.  History of Present Illness: Samantha Robinson has no prior cardiac history.  She reports a father who had died in his 20s due to an MI.  She does not smoke cigarettes but occasionally does smoke cigars 6 times per year.  Occasionally drinks.  Denied any illicit drug use.  Currently patient being evaluated for NSTEMI.  Troponins were initially normal [phone]  Started on IV heparin , loaded with aspirin , started on atorvastatin .  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  Yesterday around 1300 she suddenly started to develop chest pain that she described as a heaviness, peak pain was around 7 out of 10 with radiation to her left arm and into her neck.  Has not had an episode of chest pain like this before.  Normally ambulates without any exertional symptoms.  Symptoms were persistent until she got to the emergency room and got aspirin  and nitroglycerin .  Denies any shortness of breath, peripheral edema, orthopnea.  Reports mild palpations right now, chest pain is mild and she rates it 3 out of 10.  Otherwise she has no other acute concerns.  Chest x-ray negative.  Potassium 3.7 creatinine 0.62.  CBC with differential unremarkable.  Upon my evaluation the patient has had recurrence of chest discomfort requiring nitroglycerin  where the pain is still somewhat present.  She is hypertensive with blood pressure 163/108.  She describes a dull sensation of underneath her breast, and had a  significant headache from nitroglycerin .  Prior to yesterday she really has not had any antecedent symptoms of discomfort in the chest or dyspnea.  No edema.  No tachycardia symptoms.  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    Past Surgical History:  Procedure Laterality Date   CYSTOSCOPY N/A 01/06/2020   Procedure: CYSTOSCOPY;  Surgeon: Taam-Akelman, Rosalea K, MD;  Location: [facility];  Service: Gynecology;  Laterality: N/A;   ROBOTIC ASSISTED LAPAROSCOPIC HYSTERECTOMY AND SALPINGECTOMY Bilateral 01/06/2020   Procedure: XI ROBOTIC ASSISTED LAPAROSCOPIC HYSTERECTOMY AND SALPINGECTOMY;  Surgeon: Bonnielee Rayleen POUR, MD;  Location: [facility];  Service: Gynecology;  Laterality: Bilateral;   TUBAL LIGATION       Scheduled Meds:  aspirin  EC  81 mg Oral Daily   atorvastatin   40 mg Oral Daily   clopidogrel   75 mg Oral Daily   ferrous sulfate   325 mg Oral Q breakfast   levothyroxine   88 mcg Oral Q0600   loratadine   10 mg Oral Daily   metoprolol  tartrate  12.5 mg Oral BID   pantoprazole   40 mg Oral Daily   topiramate   25 mg Oral Daily   Continuous Infusions:  heparin  850 Units/hr (04/05/24 2039)   PRN Meds: acetaminophen  **OR**  acetaminophen , albuterol , cyclobenzaprine , morphine  injection, nitroGLYCERIN , oxyCODONE -acetaminophen , prochlorperazine , senna-docusate  Allergies:    Allergies  Allergen Reactions   Penicillins Nausea Only    Has patient had a PCN reaction causing immediate rash, facial/tongue/throat swelling, SOB or lightheadedness with hypotension: No Has patient had a PCN reaction causing severe rash involving mucus membranes or skin necrosis: No Has patient had a PCN reaction that required hospitalization: No Has patient had a  PCN reaction occurring within the last 10 years: No If all of the above answers are NO, then may proceed with Cephalosporin use.    Social History:   Social History   Socioeconomic History   Marital status: Single    Spouse name: Not on file   Number of children: Not on file   Years of education: Not on file   Highest education level: Not on file  Occupational History   Not on file  Tobacco Use   Smoking status: Never   Smokeless tobacco: Never  Vaping Use   Vaping status: Never Used  Substance and Sexual Activity   Alcohol use: Yes    Comment: occasional   Drug use: Never   Sexual activity: Not on file  Other Topics Concern   Not on file  Social History Narrative   Not on file   Social Drivers of Health   Financial Resource Strain: Not on file  Food Insecurity: Not on file  Transportation Needs: Not on file  Physical Activity: Not on file  Stress: Not on file  Social Connections: Unknown (12/06/2021)   Received from [facility]   Social Network    Social Network: Not on file  Intimate Partner Violence: Unknown (12/06/2021)   Received from [facility]   HITS    Physically Hurt: Not on file    Insult or Talk Down To: Not on file    Threaten Physical Harm: Not on file    Scream or Curse: Not on file    Family History:   History reviewed. No pertinent family history.   ROS:  Please see the history of present illness.  All other ROS reviewed and negative.     Physical Exam/Data: Vitals:   04/06/24 0030 04/06/24 0031 04/06/24 0605 04/06/24 0606  BP:  120/76  (!) 146/71  Pulse:  80  74  Resp:  20  16  Temp: 98.2 F (36.8 C) 98.2 F (36.8 C)  97.6 F (36.4 C)  TempSrc: Oral Oral  Oral  SpO2:  100%  100%  Weight:   97.7 kg   Height:   5' 1 (1.549 m)    No intake or output data in the 24 hours ending 04/06/24 0723    04/06/2024    6:05 AM 04/05/2024    8:34 PM 01/06/2020    6:07 AM  Last 3 Weights  Weight (lbs) 215 lb 6.2 oz 214 lb 222 lb 3.2  oz  Weight (kg) 97.7 kg 97.07 kg 100.789 kg     Body mass index is 40.7 kg/m.  General:  Well nourished, well developed, in no acute distress; somewhat nauseated after taking nitroglycerin .  Has a headache HEENT: normal; PERRL Neck: no JVD Vascular: No carotid bruits; Distal pulses 2+ bilaterally Cardiac:  normal S1, S2; RRR; no murmur  Lungs:  clear to auscultation bilaterally, no wheezing, rhonchi or rales  Abd: soft, nontender, no hepatomegaly  Ext: no edema Musculoskeletal:  No deformities, BUE and BLE strength normal and equal Skin: warm and dry  Neuro:  CNs 2-12  intact, no focal abnormalities noted Psych:  Normal affect   EKG:  The EKG was personally reviewed and demonstrates: Sinus rhythm, anterior T wave inversions.  Inferolateral T waves that appear chronic.  => Anterior ST depression/T wave inversions less prominent than previous studies as are the inferolateral ST depressions. Telemetry:  Telemetry was personally reviewed and demonstrates: Sinus rhythm heart rate between 70-90  Relevant CV Studies: = No prior studies  Laboratory Data: High Sensitivity Troponin:   Recent Labs  Lab 04/05/24 1421 04/05/24 1738 04/05/24 2012 04/05/24 2158 04/06/24 0233  TROPONINIHS 3 69* 303* 427* 1,215*     Chemistry Recent Labs  Lab 04/05/24 1421 04/05/24 2012 04/06/24 0233  NA 136  --  135  K 3.2*  --  3.7  CL 102  --  101  CO2 19*  --  29  GLUCOSE 97  --  95  BUN 7  --  7  CREATININE 0.67  --  0.62  CALCIUM  9.0  --  8.9  MG  --  2.1 2.1  GFRNONAA >60  --  >60  ANIONGAP 15  --  5    Recent Labs  Lab 04/06/24 0233  PROT 6.9  ALBUMIN 3.3*  AST 26  ALT 17  ALKPHOS 35*  BILITOT 0.9   Lipids  Recent Labs  Lab 04/05/24 2012  CHOL 170  TRIG 74  HDL 76  LDLCALC 79  CHOLHDL 2.2    Hematology Recent Labs  Lab 04/05/24 1421 04/06/24 0612  WBC 4.2 3.8*  RBC 4.51 4.62  HGB 11.9* 12.3  HCT 38.6 39.5  MCV 85.6 85.5  MCH 26.4 26.6  MCHC 30.8 31.1  RDW  12.4 12.5  PLT 315 289   Thyroid  No results for input(s): TSH, FREET4 in the last 168 hours.  BNPNo results for input(s): BNP, PROBNP in the last 168 hours.  DDimer No results for input(s): DDIMER in the last 168 hours.  Radiology/Studies:  DG Chest 2 View Result Date: 04/05/2024 CLINICAL DATA:  Chest and left arm pain. History of anemia and hypertension. EXAM: CHEST - 2 VIEW COMPARISON:  None Available. FINDINGS: The heart size and mediastinal contours are within normal limits. Atelectasis or scarring is noted at the lung bases. No effusion or pneumothorax is seen. No acute osseous abnormality. IMPRESSION: No active cardiopulmonary disease. Electronically Signed   By: Leita Birmingham M.D.   On: 04/05/2024 15:22     Assessment and Plan:  NSTEMI Father died of MI in his 59s, her presentation seems classic for type I NSTEMI with chest pain radiating to neck and arm.  Serial EKG with no acute ST-T wave changes  Troponins were initially normal now peaking at 1200+.  Plan for cardiac catheterization. Already loaded on aspirin , continue aspirin  81 mg daily, started on atorvastatin  40 mg, continue Lopressor  12.5 mg twice daily.  Has as needed nitroglycerin  but does not need any right now. She was written for clopidogrel  75 mg daily without a load.  She did get 1 dose yesterday.  Not adequate for loading, and given her young age, would probably consider either Brilinta or Effient if PCI indicated. Differential diagnosis is ACS non-STEMI although she is not demographic or would need to be concerned for possibility of scad or MINOCA Obtain echocardiogram Get TSH, LP(a). LDL this admission 79, will outline LDL goals after cardiac catheterization. => Is on atorvastatin  40 mg daily.  Informed Consent   Shared Decision Making/Informed Consent{  The risks [stroke (1 in 1000),  death (1 in 1000), kidney failure [usually temporary] (1 in 500), bleeding (1 in 200), allergic reaction [possibly  serious] (1 in 200)], benefits (diagnostic support and management of coronary artery disease) and alternatives of a cardiac catheterization were discussed in detail with Ms. Johannes and she is willing to proceed.      Hypertension Mildly elevated here.  Will adjust medications after cardiac catheterization.  PTA was on HCTZ and now on pressor 12.5 mg per twice daily. Blood pressure is elevated, will give 1 dose IV labetalol  10 mg-she did not tolerate the nitroglycerin  very well    Risk Assessment/Risk Scores:  TIMI Risk Score for Unstable Angina or Non-ST Elevation MI:   The patient's TIMI risk score is 4, which indicates a 20% risk of all cause mortality, new or recurrent myocardial infarction or need for urgent revascularization in the next 14 days.{     For questions or updates, please contact [facility] Please consult [email or web address] for contact info under      Signed, Thom LITTIE Sluder, PA-C  04/06/2024 7:23 AM    ATTENDING ATTESTATION  I have seen, examined and evaluated the patient this morning in the ER along with Ludie Sluder, PA.  After reviewing all the available data and chart, we discussed the patients laboratory, study & physical findings as well as symptoms in detail.  I agree with his findings, examination as well as impression recommendations as per our discussion.    Attending adjustments noted in italics.   Patient with family history of CAD but only personal history of hypertension treated with HCTZ who now presents with symptoms concerning for ACS/non-STEMI.  With recurrent chest discomfort I think plan for expedited cardiac catheterization poss PCI is warranted. Procedure discussed in detail.  Informed consent obtained. Further plans based on cath results.  On aspirin  and statin.  Low-dose Plavix  started but will defer to cathing doctor based on findings.  Echo pending. Treat BP 1 determine need for outpatient medication regimen on  discharge.     Alm MICAEL Clay, MD, MS Alm Clay, M.D., M.S. Interventional Cardiologist  [facility] Pager # [phone]

## 2024-04-06 NOTE — Plan of Care (Signed)

## 2024-04-06 NOTE — Interval H&P Note (Signed)
 History and Physical Interval Note:  04/06/2024 9:27 AM  Samantha Robinson  has presented today for surgery, with the diagnosis of NSTEMI.  The various methods of treatment have been discussed with the patient and family. After consideration of risks, benefits and other options for treatment, the patient has consented to  Procedure(s): LEFT HEART CATH AND CORONARY ANGIOGRAPHY (N/A) as a surgical intervention.  The patient's history has been reviewed, patient examined, no change in status, stable for surgery.  I have reviewed the patient's chart and labs.  Questions were answered to the patient's satisfaction.    Cath Lab Visit (complete for each Cath Lab visit)  Clinical Evaluation Leading to the Procedure:   ACS: Yes.    Non-ACS: N/A  Allyna Pittsley

## 2024-04-06 NOTE — Progress Notes (Signed)
 PHARMACY - ANTICOAGULATION CONSULT NOTE  Pharmacy Consult for heparin  Indication: chest pain/ACS  Labs: Recent Labs    04/05/24 1421 04/05/24 1738 04/05/24 2012 04/05/24 2158 04/06/24 0233 04/06/24 0612  HGB 11.9*  --   --   --   --  12.3  HCT 38.6  --   --   --   --  39.5  PLT 315  --   --   --   --  289  HEPARINUNFRC  --   --   --   --   --  0.33  CREATININE 0.67  --   --   --  0.62  --   TROPONINIHS 3   < > 303* 427* 1,215*  --    < > = values in this interval not displayed.   Assessment/Plan:  48yo female therapeutic on heparin  with initial dosing for CP. Will continue infusion at current rate of 850 units/hr and confirm stable with additional level.  Marvetta Dauphin, PharmD, BCPS 04/06/2024 6:58 AM

## 2024-04-06 NOTE — Consult Note (Addendum)
 Cardiology Consultation   Patient ID: Samantha Robinson MRN: [id number]; DOB: 1975/09/11  Admit date: 04/05/2024 Date of Consult: 04/06/2024  PCP:  Ransom Other, MD   [facility] Providers Cardiologist:  None   Patient Profile: Samantha Robinson is a 48 y.o. female with a hx of hypertension, hypothyroidism who is being seen 04/06/2024 for the evaluation of NSTEMI at the request of Dr. Lorren.  History of Present Illness: Samantha Robinson has no prior cardiac history.  She reports a father who had died in his 20s due to an MI.  She does not smoke cigarettes but occasionally does smoke cigars 6 times per year.  Occasionally drinks.  Denied any illicit drug use.  Currently patient being evaluated for NSTEMI.  Troponins were initially normal [phone]  Started on IV heparin , loaded with aspirin , started on atorvastatin .  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  Yesterday around 1300 she suddenly started to develop chest pain that she described as a heaviness, peak pain was around 7 out of 10 with radiation to her left arm and into her neck.  Has not had an episode of chest pain like this before.  Normally ambulates without any exertional symptoms.  Symptoms were persistent until she got to the emergency room and got aspirin  and nitroglycerin .  Denies any shortness of breath, peripheral edema, orthopnea.  Reports mild palpations right now, chest pain is mild and she rates it 3 out of 10.  Otherwise she has no other acute concerns.  Chest x-ray negative.  Potassium 3.7 creatinine 0.62.  CBC with differential unremarkable.  Upon my evaluation the patient has had recurrence of chest discomfort requiring nitroglycerin  where the pain is still somewhat present.  She is hypertensive with blood pressure 163/108.  She describes a dull sensation of underneath her breast, and had a  significant headache from nitroglycerin .  Prior to yesterday she really has not had any antecedent symptoms of discomfort in the chest or dyspnea.  No edema.  No tachycardia symptoms.  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    Past Surgical History:  Procedure Laterality Date   CYSTOSCOPY N/A 01/06/2020   Procedure: CYSTOSCOPY;  Surgeon: Taam-Akelman, Rosalea K, MD;  Location: [facility];  Service: Gynecology;  Laterality: N/A;   ROBOTIC ASSISTED LAPAROSCOPIC HYSTERECTOMY AND SALPINGECTOMY Bilateral 01/06/2020   Procedure: XI ROBOTIC ASSISTED LAPAROSCOPIC HYSTERECTOMY AND SALPINGECTOMY;  Surgeon: Bonnielee Rayleen POUR, MD;  Location: [facility];  Service: Gynecology;  Laterality: Bilateral;   TUBAL LIGATION       Scheduled Meds:  aspirin  EC  81 mg Oral Daily   atorvastatin   40 mg Oral Daily   clopidogrel   75 mg Oral Daily   ferrous sulfate   325 mg Oral Q breakfast   levothyroxine   88 mcg Oral Q0600   loratadine   10 mg Oral Daily   metoprolol  tartrate  12.5 mg Oral BID   pantoprazole   40 mg Oral Daily   topiramate   25 mg Oral Daily   Continuous Infusions:  heparin  850 Units/hr (04/05/24 2039)   PRN Meds: acetaminophen  **OR**  acetaminophen , albuterol , cyclobenzaprine , morphine  injection, nitroGLYCERIN , oxyCODONE -acetaminophen , prochlorperazine , senna-docusate  Allergies:    Allergies  Allergen Reactions   Penicillins Nausea Only    Has patient had a PCN reaction causing immediate rash, facial/tongue/throat swelling, SOB or lightheadedness with hypotension: No Has patient had a PCN reaction causing severe rash involving mucus membranes or skin necrosis: No Has patient had a PCN reaction that required hospitalization: No Has patient had a  PCN reaction occurring within the last 10 years: No If all of the above answers are NO, then may proceed with Cephalosporin use.    Social History:   Social History   Socioeconomic History   Marital status: Single    Spouse name: Not on file   Number of children: Not on file   Years of education: Not on file   Highest education level: Not on file  Occupational History   Not on file  Tobacco Use   Smoking status: Never   Smokeless tobacco: Never  Vaping Use   Vaping status: Never Used  Substance and Sexual Activity   Alcohol use: Yes    Comment: occasional   Drug use: Never   Sexual activity: Not on file  Other Topics Concern   Not on file  Social History Narrative   Not on file   Social Drivers of Health   Financial Resource Strain: Not on file  Food Insecurity: Not on file  Transportation Needs: Not on file  Physical Activity: Not on file  Stress: Not on file  Social Connections: Unknown (12/06/2021)   Received from [facility]   Social Network    Social Network: Not on file  Intimate Partner Violence: Unknown (12/06/2021)   Received from [facility]   HITS    Physically Hurt: Not on file    Insult or Talk Down To: Not on file    Threaten Physical Harm: Not on file    Scream or Curse: Not on file    Family History:   History reviewed. No pertinent family history.   ROS:  Please see the history of present illness.  All other ROS reviewed and negative.     Physical Exam/Data: Vitals:   04/06/24 0030 04/06/24 0031 04/06/24 0605 04/06/24 0606  BP:  120/76  (!) 146/71  Pulse:  80  74  Resp:  20  16  Temp: 98.2 F (36.8 C) 98.2 F (36.8 C)  97.6 F (36.4 C)  TempSrc: Oral Oral  Oral  SpO2:  100%  100%  Weight:   97.7 kg   Height:   5' 1 (1.549 m)    No intake or output data in the 24 hours ending 04/06/24 0723    04/06/2024    6:05 AM 04/05/2024    8:34 PM 01/06/2020    6:07 AM  Last 3 Weights  Weight (lbs) 215 lb 6.2 oz 214 lb 222 lb 3.2  oz  Weight (kg) 97.7 kg 97.07 kg 100.789 kg     Body mass index is 40.7 kg/m.  General:  Well nourished, well developed, in no acute distress; somewhat nauseated after taking nitroglycerin .  Has a headache HEENT: normal; PERRL Neck: no JVD Vascular: No carotid bruits; Distal pulses 2+ bilaterally Cardiac:  normal S1, S2; RRR; no murmur  Lungs:  clear to auscultation bilaterally, no wheezing, rhonchi or rales  Abd: soft, nontender, no hepatomegaly  Ext: no edema Musculoskeletal:  No deformities, BUE and BLE strength normal and equal Skin: warm and dry  Neuro:  CNs 2-12  intact, no focal abnormalities noted Psych:  Normal affect   EKG:  The EKG was personally reviewed and demonstrates: Sinus rhythm, anterior T wave inversions.  Inferolateral T waves that appear chronic.  => Anterior ST depression/T wave inversions less prominent than previous studies as are the inferolateral ST depressions. Telemetry:  Telemetry was personally reviewed and demonstrates: Sinus rhythm heart rate between 70-90  Relevant CV Studies: = No prior studies  Laboratory Data: High Sensitivity Troponin:   Recent Labs  Lab 04/05/24 1421 04/05/24 1738 04/05/24 2012 04/05/24 2158 04/06/24 0233  TROPONINIHS 3 69* 303* 427* 1,215*     Chemistry Recent Labs  Lab 04/05/24 1421 04/05/24 2012 04/06/24 0233  NA 136  --  135  K 3.2*  --  3.7  CL 102  --  101  CO2 19*  --  29  GLUCOSE 97  --  95  BUN 7  --  7  CREATININE 0.67  --  0.62  CALCIUM  9.0  --  8.9  MG  --  2.1 2.1  GFRNONAA >60  --  >60  ANIONGAP 15  --  5    Recent Labs  Lab 04/06/24 0233  PROT 6.9  ALBUMIN 3.3*  AST 26  ALT 17  ALKPHOS 35*  BILITOT 0.9   Lipids  Recent Labs  Lab 04/05/24 2012  CHOL 170  TRIG 74  HDL 76  LDLCALC 79  CHOLHDL 2.2    Hematology Recent Labs  Lab 04/05/24 1421 04/06/24 0612  WBC 4.2 3.8*  RBC 4.51 4.62  HGB 11.9* 12.3  HCT 38.6 39.5  MCV 85.6 85.5  MCH 26.4 26.6  MCHC 30.8 31.1  RDW  12.4 12.5  PLT 315 289   Thyroid  No results for input(s): TSH, FREET4 in the last 168 hours.  BNPNo results for input(s): BNP, PROBNP in the last 168 hours.  DDimer No results for input(s): DDIMER in the last 168 hours.  Radiology/Studies:  DG Chest 2 View Result Date: 04/05/2024 CLINICAL DATA:  Chest and left arm pain. History of anemia and hypertension. EXAM: CHEST - 2 VIEW COMPARISON:  None Available. FINDINGS: The heart size and mediastinal contours are within normal limits. Atelectasis or scarring is noted at the lung bases. No effusion or pneumothorax is seen. No acute osseous abnormality. IMPRESSION: No active cardiopulmonary disease. Electronically Signed   By: Leita Birmingham M.D.   On: 04/05/2024 15:22     Assessment and Plan:  NSTEMI Father died of MI in his 59s, her presentation seems classic for type I NSTEMI with chest pain radiating to neck and arm.  Serial EKG with no acute ST-T wave changes  Troponins were initially normal now peaking at 1200+.  Plan for cardiac catheterization. Already loaded on aspirin , continue aspirin  81 mg daily, started on atorvastatin  40 mg, continue Lopressor  12.5 mg twice daily.  Has as needed nitroglycerin  but does not need any right now. She was written for clopidogrel  75 mg daily without a load.  She did get 1 dose yesterday.  Not adequate for loading, and given her young age, would probably consider either Brilinta or Effient if PCI indicated. Differential diagnosis is ACS non-STEMI although she is not demographic or would need to be concerned for possibility of scad or MINOCA Obtain echocardiogram Get TSH, LP(a). LDL this admission 79, will outline LDL goals after cardiac catheterization. => Is on atorvastatin  40 mg daily.  Informed Consent   Shared Decision Making/Informed Consent{  The risks [stroke (1 in 1000),  death (1 in 1000), kidney failure [usually temporary] (1 in 500), bleeding (1 in 200), allergic reaction [possibly  serious] (1 in 200)], benefits (diagnostic support and management of coronary artery disease) and alternatives of a cardiac catheterization were discussed in detail with Samantha Robinson and she is willing to proceed.      Hypertension Mildly elevated here.  Will adjust medications after cardiac catheterization.  PTA was on HCTZ and now on pressor 12.5 mg per twice daily. Blood pressure is elevated, will give 1 dose IV labetalol  10 mg-she did not tolerate the nitroglycerin  very well    Risk Assessment/Risk Scores:  TIMI Risk Score for Unstable Angina or Non-ST Elevation MI:   The patient's TIMI risk score is 4, which indicates a 20% risk of all cause mortality, new or recurrent myocardial infarction or need for urgent revascularization in the next 14 days.{     For questions or updates, please contact [facility] Please consult [email or web address] for contact info under      Signed, Thom LITTIE Sluder, PA-C  04/06/2024 7:23 AM    ATTENDING ATTESTATION  I have seen, examined and evaluated the patient this morning in the ER along with Ludie Sluder, PA.  After reviewing all the available data and chart, we discussed the patients laboratory, study & physical findings as well as symptoms in detail.  I agree with his findings, examination as well as impression recommendations as per our discussion.    Attending adjustments noted in italics.   Patient with family history of CAD but only personal history of hypertension treated with HCTZ who now presents with symptoms concerning for ACS/non-STEMI.  With recurrent chest discomfort I think plan for expedited cardiac catheterization poss PCI is warranted. Procedure discussed in detail.  Informed consent obtained. Further plans based on cath results.  On aspirin  and statin.  Low-dose Plavix  started but will defer to cathing doctor based on findings.  Echo pending. Treat BP 1 determine need for outpatient medication regimen on  discharge.     Alm MICAEL Clay, MD, MS Alm Clay, M.D., M.S. Interventional Cardiologist  [facility] Pager # [phone]

## 2024-04-07 ENCOUNTER — Other Ambulatory Visit (HOSPITAL_COMMUNITY): Payer: Self-pay

## 2024-04-07 DIAGNOSIS — E782 Mixed hyperlipidemia: Secondary | ICD-10-CM | POA: Diagnosis not present

## 2024-04-07 DIAGNOSIS — I401 Isolated myocarditis: Secondary | ICD-10-CM | POA: Diagnosis not present

## 2024-04-07 DIAGNOSIS — I10 Essential (primary) hypertension: Secondary | ICD-10-CM | POA: Diagnosis not present

## 2024-04-07 DIAGNOSIS — I409 Acute myocarditis, unspecified: Secondary | ICD-10-CM | POA: Diagnosis present

## 2024-04-07 LAB — BASIC METABOLIC PANEL WITH GFR
Anion gap: 12 (ref 5–15)
BUN: 10 mg/dL (ref 6–20)
CO2: 22 mmol/L (ref 22–32)
Calcium: 9.1 mg/dL (ref 8.9–10.3)
Chloride: 103 mmol/L (ref 98–111)
Creatinine, Ser: 0.76 mg/dL (ref 0.44–1.00)
GFR, Estimated: >60 mL/min (ref >60)
Glucose, Bld: 84 mg/dL (ref 70–99)
Potassium: 3.8 mmol/L (ref 3.5–5.1)
Sodium: 137 mmol/L (ref 135–145)

## 2024-04-07 LAB — CBC
HCT: 43 % (ref 36.0–46.0)
Hemoglobin: 13.2 g/dL (ref 12.0–15.0)
MCH: 26.6 pg (ref 26.0–34.0)
MCHC: 30.7 g/dL (ref 30.0–36.0)
MCV: 86.5 fL (ref 80.0–100.0)
Platelets: 383 K/uL (ref 150–400)
RBC: 4.97 MIL/uL (ref 3.87–5.11)
RDW: 12.6 % (ref 11.5–15.5)
WBC: 5 K/uL (ref 4.0–10.5)
nRBC: 0 % (ref 0.0–0.2)

## 2024-04-07 LAB — T4, FREE: Free T4: 0.9 ng/dL (ref 0.61–1.12)

## 2024-04-07 LAB — TROPONIN I (HIGH SENSITIVITY)
Troponin I (High Sensitivity): 319 ng/L (ref <18)
Troponin I (High Sensitivity): 419 ng/L (ref <18)

## 2024-04-07 LAB — SEDIMENTATION RATE: Sed Rate: 10 mm/h (ref 0–22)

## 2024-04-07 LAB — TSH: TSH: 0.11 u[IU]/mL — ABNORMAL LOW (ref 0.350–4.500)

## 2024-04-07 LAB — C-REACTIVE PROTEIN: CRP: 1.1 mg/dL — ABNORMAL HIGH (ref <1.0)

## 2024-04-07 MED ORDER — ATORVASTATIN CALCIUM 40 MG PO TABS
40.0000 mg | ORAL_TABLET | Freq: Every day | ORAL | 0 refills | Status: DC
  Filled 2024-04-07: qty 30, 30d supply, fill #0

## 2024-04-07 MED ORDER — ASPIRIN 81 MG PO TBEC
81.0000 mg | DELAYED_RELEASE_TABLET | Freq: Every day | ORAL | 12 refills | Status: DC
  Filled 2024-04-07 – 2024-05-05 (×2): qty 30, 30d supply, fill #0
  Filled 2024-06-15: qty 90, 90d supply, fill #1

## 2024-04-07 MED ORDER — METOPROLOL TARTRATE 25 MG PO TABS
25.0000 mg | ORAL_TABLET | Freq: Two times a day (BID) | ORAL | Status: DC
  Administered 2024-04-07: 25 mg via ORAL
  Filled 2024-04-07: qty 1

## 2024-04-07 MED ORDER — METOPROLOL TARTRATE 25 MG PO TABS
25.0000 mg | ORAL_TABLET | Freq: Two times a day (BID) | ORAL | 0 refills | Status: DC
  Filled 2024-04-07: qty 60, 30d supply, fill #0

## 2024-04-07 MED ORDER — LEVOTHYROXINE SODIUM 75 MCG PO TABS
75.0000 ug | ORAL_TABLET | Freq: Every day | ORAL | 11 refills | Status: AC
  Filled 2024-04-07 – 2024-05-05 (×2): qty 30, 30d supply, fill #0

## 2024-04-07 NOTE — Progress Notes (Signed)
  Progress Note  Patient Name: Samantha Robinson Date of Encounter: 04/07/2024 [facility] Cardiologist: Alm Clay, MD   Interval Summary   Significant improvement in symptoms compared to yesterday.  She is sitting up in bed fully dressed in opening home.  She does have some mild intermittent nagging sensation under her left breast.  No dyspnea.  Vital Signs Vitals:   04/06/24 1945 04/06/24 2322 04/07/24 0521 04/07/24 0740  BP: 118/71 115/72 (!) 143/96 133/83  Pulse: 70  75 75  Resp: 20  20 18   Temp: 98 F (36.7 C) 98.3 F (36.8 C) 98.1 F (36.7 C) 98 F (36.7 C)  TempSrc: Oral Oral Oral Oral  SpO2: 100% 100% 99% 100%  Weight:      Height:        Intake/Output Summary (Last 24 hours) at 04/07/2024 1025 Last data filed at 04/07/2024 0600 Gross per 24 hour  Intake 730 ml  Output --  Net 730 ml      04/06/2024    6:05 AM 04/05/2024    8:34 PM 01/06/2020    6:07 AM  Last 3 Weights  Weight (lbs) 215 lb 6.2 oz 214 lb 222 lb 3.2 oz  Weight (kg) 97.7 kg 97.07 kg 100.789 kg     Cardiology Studies LEFT HEART CATH AND CORONARY ANGIOGRAPHY (04/06/2024): 50% proximal RCA, 30% mid to distal RCA.  Mildly reduced LV function with normal LVEDP.  With mild global systolic dysfunction, consider possibility of myocarditis. ECHOCARDIOGRAM (04/06/2024): EF 50 to 55%.  No RWMA.  G1 DD.  Normal PAP.  Mild MR.  Mild to moderate TR.  Normal aortic valve.  Normal RAP. CARDIAC MRI (04/06/2024): Normal LV size and function.  EF 53%.  Normal RV size and function EF 53%.  Regional elevation in T1/T2/ECV meets criteria for acute myocarditis.   Telemetry/ECG  Sinus rhythm- Personally Reviewed  Physical Exam  GEN: No acute distress.   Neck: No JVD Cardiac: RRR, no murmurs, rubs, or gallops.  Respiratory: Clear to auscultation bilaterally. GI: Soft, nontender, non-distended  MS: No edema  Assessment & Plan  Active Problems:   Acute myocarditis   HLD (hyperlipidemia)    Essential hypertension   Asthma, mild intermittent   Hypothyroidism   Prolonged Q-T interval on ECG  Initially admitted as a possible non-STEMI, cardiac catheterization did not show any obstructive CAD, but did show some mild global hypokinesis suggest the possibility of myocarditis.  Cardiac MRI yesterday confirmed regional myocarditis.  This explains the patient's elevated troponin.  Will change diagnosis from non-STEMI to acute myocarditis. CRP and ESR pending, if these levels are notably elevated, could consider colchicine but otherwise there is no clear indication for colchicine. => If elevated would consider 1 month colchicine 0.6 mg twice daily and continue aspirin  325 mg x 1 month Titrate metoprolol  to 25 mg twice daily, and if BP tolerates would consider ARB Strict activity  Restriction for the next 3 months-no heavy exertion Not unreasonable to continue PPI and statin, but can likely stop aspirin  if ESR level is low..   Hypothyroidism managed by primary service-on Synthroid .  Will follow along to see the results of ESR/CRP but anticipate that she would otherwise be ready for discharge today    For questions or updates, please contact [facility] Please consult [email or web address] for contact info under       Signed, Alm Clay, MD

## 2024-04-07 NOTE — Plan of Care (Signed)

## 2024-04-07 NOTE — Progress Notes (Signed)
 CARDIAC REHAB PHASE I     Pt resting in bed, feeling well today. Pt reports ambulating independently with no CP, SOB or dizziness. Post MI education including restrictions, risk factors, exercise guidelines, antiplatelet, MI booklet, heart healthy diet and CRP2 reviewed. All questions and concerns addressed. Will refer to [facility] for CRP2. Plan for discharge home later today.    [id number] Vaughn Asberry Hacking, RN BSN 04/07/2024 10:20 AM

## 2024-04-07 NOTE — Progress Notes (Signed)
 Explained discharge instructions to patient. Reviewed follow up appointment and next medication administration times. Also reviewed education. Patient verbalized having an understanding for instructions given. All belongings are in the patient's possession. Will go to the discharge lounge to wait for meds. IV and telemetry were removed. CCMD was notified. No other needs verbalized. Will transport downstairs for discharge.

## 2024-04-07 NOTE — Discharge Summary (Addendum)
 Physician Discharge Summary  Samantha Robinson FMW:[id number] DOB: Jul 14, 1975 DOA: 04/05/2024  PCP: Ransom Other, MD  Admit date: 04/05/2024 Discharge date: 04/07/2024 30 Day Unplanned Readmission Risk Score    Flowsheet Row ED to Hosp-Admission (Current) from 04/05/2024 in [facility]  30 Day Unplanned Readmission Risk Score (%) 11.91 Filed at 04/07/2024 1200    This score is the patient's risk of an unplanned readmission within 30 days of being discharged (0 -100%). The score is based on dignosis, age, lab data, medications, orders, and past utilization.   Low:  0-14.9   Medium: 15-21.9   High: 22-29.9   Extreme: 30 and above          Admitted From: Home Disposition: Home Home  Recommendations for Outpatient Follow-up:  Follow up with PCP in 1-2 weeks Please obtain BMP/CBC in one week Follow-up with cardiology in 3 months Please follow up with your PCP on the following pending results: Unresulted Labs (From admission, onward)     Start     Ordered   04/13/24 0500  Creatinine, serum  (enoxaparin  (LOVENOX )    CrCl >/= 30 ml/min)  Weekly,   R     Comments: while on enoxaparin  therapy    04/06/24 1344   04/07/24 0815  T3  ONCE - URGENT,   URGENT        04/07/24 0814   04/07/24 0500  Lipoprotein A (LPA)  Tomorrow morning,   R        04/06/24 0724   04/07/24 0500  CBC  Daily,   R      04/06/24 0726              Home Health: None Equipment/Devices: None none  Discharge Condition: Stable CODE STATUS: Full code Diet recommendation:  Diet Order             Diet Heart Room service appropriate? Yes; Fluid consistency: Thin  Diet effective now                 Due to brief hospitalization, I have copied admitting hospitalist HPI and ED course as below.  HPI: Samantha Robinson is a 48 y.o. female with medical history significant of hypothyroidism, hypertension, hyperlipidemia, GERD, morbid obesity class III, and asthma presented to hospital  complaint of chest pain.  Patient states that she got into work here at [employer] around 1 PM this afternoon.  After getting in she said that she started to experience pressure-like chest pain midsternal region 7 8 out of 10 in intensity and associated with left arm numbness and tingling.  Some shortness of breath no diaphoresis.  Said she took a couple deep breaths but symptoms were not improving and therefore she decided to come to the emergency room for evaluation and treatment.   ED Course:  In the ER, BP 132/82, HR 76, R 16 R, O2 saturation 100% on room air,  and Tmax 98.5. Cbc demonstrated wbc,  hb/hct, and platelet. Chemistry demonstrated Na 136, K 3.2, Cl 102, bicarb 19, Bun/Cr 7/0.67 and glucose 97. Anion gap 15. Initial troponin was 3 and repeat was 69.  CXR demonstrated no acute cardiopulmonary findings. Urinalysis demonstrated. EKG demonstrated normal sinus rhythm prolonged QT with inverted t wave in anterolateral leads  Subjective: Seen and examined, feels well.  Just feels some uncomfortable sensation at times at left chest but otherwise no real chest pain or any other complaint.  Brief/Interim Summary: Details below.  Chest pain/elevated troponin/myocarditis:  Troponin delta impressively positive started with 3 continued to rise and peaked at 1215.  Patient was given aspirin  and Plavix , started on heparin .  Allergy consulted, patient underwent cardiac cath on 04/06/2024, was found to have clean coronaries.  Cardiology suspected myocarditis.  Patient underwent MRI of the cardiac which confirmed myocarditis.  Patient's troponins are improving.  She is chest pain-free.  Patient's inflammatory markers are unremarkable.  Therefore cardiology does not recommend colchicine for discharge.  They have started her on aspirin  and beta-blocker.   Essential hypertension: Was on hydrochlorothiazide  at home which was held and discontinued by cardiology and they recommended sending home on Lopressor  only.   Recommended discontinuing lomaira .   Mild intermittent asthma: Asymptomatic.  Continue albuterol  inhalers as needed.   Acquired hypothyroidism: TSH low, patient PTA was on Synthroid  88 mcg daily, this has been reduced to 75 mcg daily.  Recommend repeating TSH in 6 weeks.   Hyperlipidemia: Continue atorvastatin .   Prolonged QTc: Monitor closely and avoid QTc prolonging medications.   Discharge Diagnoses:  Active Problems:   Acute myocarditis   HLD (hyperlipidemia)   Essential hypertension   Asthma, mild intermittent   Hypothyroidism   Prolonged Q-T interval on ECG    Discharge Instructions  Discharge Instructions     Amb Referral to Cardiac Rehabilitation   Complete by: As directed    Diagnosis: NSTEMI   After initial evaluation and assessments completed: Virtual Based Care may be provided alone or in conjunction with Phase 2 Cardiac Rehab based on patient barriers.: Yes   Intensive Cardiac Rehabilitation (ICR) [facility] location only OR Traditional Cardiac Rehabilitation (TCR) *If criteria for ICR are not met will enroll in TCR ([facility] only): Yes      Allergies as of 04/07/2024       Reactions   Penicillins Nausea Only   Has patient had a PCN reaction causing immediate rash, facial/tongue/throat swelling, SOB or lightheadedness with hypotension: No Has patient had a PCN reaction causing severe rash involving mucus membranes or skin necrosis: No Has patient had a PCN reaction that required hospitalization: No Has patient had a PCN reaction occurring within the last 10 years: No If all of the above answers are NO, then may proceed with Cephalosporin use.        Medication List     STOP taking these medications    hydrochlorothiazide  25 MG tablet Commonly known as: HYDRODIURIL    Lomaira  8 MG Tabs Generic drug: Phentermine  HCl   loratadine  10 MG tablet Commonly known as: CLARITIN        TAKE these medications    albuterol  108 (90 Base) MCG/ACT inhaler Commonly  known as: VENTOLIN  HFA Inhale 2 puffs into the lungs every 6 (six) hours as needed.   aspirin  EC 81 MG tablet Take 1 tablet (81 mg total) by mouth daily. Swallow whole. Start taking on: April 08, 2024   atorvastatin  40 MG tablet Commonly known as: LIPITOR Take 1 tablet (40 mg total) by mouth daily. Start taking on: April 08, 2024   citalopram  20 MG tablet Commonly known as: CELEXA  Take 1 tablet (20 mg total) by mouth daily.   cyclobenzaprine  10 MG tablet Commonly known as: FLEXERIL  Take 1 tablet (10 mg total) by mouth at bedtime as needed. What changed: reasons to take this   ferrous sulfate  325 (65 FE) MG tablet Take 325 mg by mouth daily with breakfast.   fluticasone  50 MCG/ACT nasal spray Commonly known as: FLONASE  Place 2 sprays into both nostrils daily. What  changed: Another medication with the same name was removed. Continue taking this medication, and follow the directions you see here.   levocetirizine 5 MG tablet Commonly known as: XYZAL  Take 1 tablet (5 mg total) by mouth every evening. What changed: when to take this   levothyroxine  75 MCG tablet Commonly known as: Synthroid  Take 1 tablet (75 mcg total) by mouth daily. What changed:  medication strength how much to take when to take this Another medication with the same name was removed. Continue taking this medication, and follow the directions you see here.   metoprolol  tartrate 25 MG tablet Commonly known as: LOPRESSOR  Take 1 tablet (25 mg total) by mouth 2 (two) times daily.   omeprazole  20 MG capsule Commonly known as: PRILOSEC Take 20 mg by mouth daily.   omeprazole  40 MG capsule Commonly known as: PRILOSEC Take 1 capsule (40 mg total) by mouth daily 1/2 to 1 hour before morning meal   oxyCODONE -acetaminophen  5-325 MG tablet Commonly known as: Percocet Take 1 tablet by mouth every 6 (six) hours as needed for severe pain.   topiramate  25 MG tablet Commonly known as: TOPAMAX  Take 1  tablet (25 mg total) by mouth daily. What changed: Another medication with the same name was removed. Continue taking this medication, and follow the directions you see here.   Vitamin D  (Ergocalciferol ) 1.25 MG (50000 UNIT) Caps capsule Commonly known as: DRISDOL  Take 1 capsule by mouth once a week. What changed: when to take this        Follow-up Information     Husain, Karrar, MD Follow up in 1 week(s).   Specialty: Internal Medicine Contact information: [address] [place] [address] [phone]         Anner Alm ORN, MD Follow up in 3 month(s).   Specialty: Cardiology Contact information: [address] [place] [address] [phone]                Allergies  Allergen Reactions   Penicillins Nausea Only    Has patient had a PCN reaction causing immediate rash, facial/tongue/throat swelling, SOB or lightheadedness with hypotension: No Has patient had a PCN reaction causing severe rash involving mucus membranes or skin necrosis: No Has patient had a PCN reaction that required hospitalization: No Has patient had a PCN reaction occurring within the last 10 years: No If all of the above answers are NO, then may proceed with Cephalosporin use.    Consultations: Cardiology   Procedures/Studies: MR CARDIAC MORPHOLOGY W WO CONTRAST Result Date: 04/06/2024 CLINICAL DATA:  89M p/w MINOCA. Peak troponin 1200. LHC showed nonobstructive CAD. Echo with EF 50-55% EXAM: CARDIAC MRI TECHNIQUE: The patient was scanned on a 1.5 Tesla Siemens magnet. A dedicated cardiac coil was used. Functional imaging was done using Fiesta sequences. 2,3, and 4 chamber views were done to assess for RWMA's. Modified Simpson's rule using a short axis stack was used to calculate an ejection fraction on a dedicated work [employer]. The patient received 10 cc of Gadavist . After 10 minutes inversion recovery sequences were used to assess for  infiltration and scar tissue. Phase contrast velocity mapping was performed above the aortic and pulmonic valves CONTRAST:  10 cc  of Gadavist  FINDINGS: Left ventricle: -Normal size -No hypertrophy -Normal systolic function -Regional elevation in native T1 (up to in basal inferior wall) and T2 (up to 59ms in basal inferior wall). Elevated ECV (30%) -No LGE LV EF:  53% (Normal 52-79%)  Absolute volumes: LV EDV: (Normal 78-167 mL) LV ESV: 74mL (Normal 21-64 mL) LV SV: 85mL (Normal 52-114 mL) CO: 6.2L/min (Normal 2.7-6.3 L/min) Indexed volumes: LV EDV: 80mL/sq-m (Normal 50-96 mL/sq-m) LV ESV: 13mL/sq-m (Normal 10-40 mL/sq-m) LV SV: 26mL/sq-m (Normal 33-64 mL/sq-m) CI: 3.0L/min/sq-m (Normal 1.9-3.9 L/min/sq-m) Right ventricle: Normal size and systolic function RV EF: 52% (Normal 52-80%) Absolute volumes: RV EDV: (Normal 79-175 mL) RV ESV: 88mL (Normal 13-75 mL) RV SV: 94mL (Normal 56-110 mL) CO: 6.8L/min (Normal 2.7-6 L/min) Indexed volumes: RV EDV: 8mL/sq-m (Normal 51-97 mL/sq-m) RV ESV: 18mL/sq-m (Normal 9-42 mL/sq-m) RV SV: 22mL/sq-m (Normal 35-61 mL/sq-m) CI: 3.3L/min/sq-m (Normal 1.8-3.8 L/min/sq-m) Left atrium: Mild enlargement Right atrium: Normal size Mitral valve: Mild regurgitation Aortic valve: Tricuspid no regurgitation Tricuspid valve: Mild regurgitation Pulmonic valve: No regurgitation Aorta: Normal proximal ascending aorta Pericardium: Trivial effusion IMPRESSION: 1. Meets criteria for acute myocarditis with regional elevation in native T1/T2/ECV 2.  No late gadolinium enhancement to suggest myocardial scar 3. Normal LV size, no hypertrophy, and normal systolic function (EF 53%) 4.  Normal RV size and systolic function (EF 52%) Electronically Signed   By: Lonni Nanas M.D.   On: 04/06/2024 22:33   MR CARDIAC VELOCITY FLOW MAP Result Date: 04/06/2024 CLINICAL DATA:  47M p/w MINOCA. Peak troponin 1200. LHC showed nonobstructive CAD. Echo with EF 50-55% EXAM: CARDIAC MRI  TECHNIQUE: The patient was scanned on a 1.5 Tesla Siemens magnet. A dedicated cardiac coil was used. Functional imaging was done using Fiesta sequences. 2,3, and 4 chamber views were done to assess for RWMA's. Modified Simpson's rule using a short axis stack was used to calculate an ejection fraction on a dedicated work [employer]. The patient received 10 cc of Gadavist . After 10 minutes inversion recovery sequences were used to assess for infiltration and scar tissue. Phase contrast velocity mapping was performed above the aortic and pulmonic valves CONTRAST:  10 cc  of Gadavist  FINDINGS: Left ventricle: -Normal size -No hypertrophy -Normal systolic function -Regional elevation in native T1 (up to in basal inferior wall) and T2 (up to 59ms in basal inferior wall). Elevated ECV (30%) -No LGE LV EF:  53% (Normal 52-79%) Absolute volumes: LV EDV: (Normal 78-167 mL) LV ESV: 74mL (Normal 21-64 mL) LV SV: 85mL (Normal 52-114 mL) CO: 6.2L/min (Normal 2.7-6.3 L/min) Indexed volumes: LV EDV: 16mL/sq-m (Normal 50-96 mL/sq-m) LV ESV: 36mL/sq-m (Normal 10-40 mL/sq-m) LV SV: 51mL/sq-m (Normal 33-64 mL/sq-m) CI: 3.0L/min/sq-m (Normal 1.9-3.9 L/min/sq-m) Right ventricle: Normal size and systolic function RV EF: 52% (Normal 52-80%) Absolute volumes: RV EDV: (Normal 79-175 mL) RV ESV: 88mL (Normal 13-75 mL) RV SV: 94mL (Normal 56-110 mL) CO: 6.8L/min (Normal 2.7-6 L/min) Indexed volumes: RV EDV: 45mL/sq-m (Normal 51-97 mL/sq-m) RV ESV: 72mL/sq-m (Normal 9-42 mL/sq-m) RV SV: 57mL/sq-m (Normal 35-61 mL/sq-m) CI: 3.3L/min/sq-m (Normal 1.8-3.8 L/min/sq-m) Left atrium: Mild enlargement Right atrium: Normal size Mitral valve: Mild regurgitation Aortic valve: Tricuspid no regurgitation Tricuspid valve: Mild regurgitation Pulmonic valve: No regurgitation Aorta: Normal proximal ascending aorta Pericardium: Trivial effusion IMPRESSION: 1. Meets criteria for acute myocarditis with regional elevation in  native T1/T2/ECV 2.  No late gadolinium enhancement to suggest myocardial scar 3. Normal LV size, no hypertrophy, and normal systolic function (EF 53%) 4.  Normal RV size and systolic function (EF 52%) Electronically Signed   By: Lonni Nanas M.D.   On: 04/06/2024 22:33   MR CARDIAC VELOCITY FLOW MAP Result Date: 04/06/2024 CLINICAL DATA:  47M p/w MINOCA. Peak troponin 1200.  LHC showed nonobstructive CAD. Echo with EF 50-55% EXAM: CARDIAC MRI TECHNIQUE: The patient was scanned on a 1.5 Tesla Siemens magnet. A dedicated cardiac coil was used. Functional imaging was done using Fiesta sequences. 2,3, and 4 chamber views were done to assess for RWMA's. Modified Simpson's rule using a short axis stack was used to calculate an ejection fraction on a dedicated work [employer]. The patient received 10 cc of Gadavist . After 10 minutes inversion recovery sequences were used to assess for infiltration and scar tissue. Phase contrast velocity mapping was performed above the aortic and pulmonic valves CONTRAST:  10 cc  of Gadavist  FINDINGS: Left ventricle: -Normal size -No hypertrophy -Normal systolic function -Regional elevation in native T1 (up to in basal inferior wall) and T2 (up to 59ms in basal inferior wall). Elevated ECV (30%) -No LGE LV EF:  53% (Normal 52-79%) Absolute volumes: LV EDV: (Normal 78-167 mL) LV ESV: 74mL (Normal 21-64 mL) LV SV: 85mL (Normal 52-114 mL) CO: 6.2L/min (Normal 2.7-6.3 L/min) Indexed volumes: LV EDV: 37mL/sq-m (Normal 50-96 mL/sq-m) LV ESV: 61mL/sq-m (Normal 10-40 mL/sq-m) LV SV: 21mL/sq-m (Normal 33-64 mL/sq-m) CI: 3.0L/min/sq-m (Normal 1.9-3.9 L/min/sq-m) Right ventricle: Normal size and systolic function RV EF: 52% (Normal 52-80%) Absolute volumes: RV EDV: (Normal 79-175 mL) RV ESV: 88mL (Normal 13-75 mL) RV SV: 94mL (Normal 56-110 mL) CO: 6.8L/min (Normal 2.7-6 L/min) Indexed volumes: RV EDV: 23mL/sq-m (Normal 51-97 mL/sq-m) RV ESV:  87mL/sq-m (Normal 9-42 mL/sq-m) RV SV: 74mL/sq-m (Normal 35-61 mL/sq-m) CI: 3.3L/min/sq-m (Normal 1.8-3.8 L/min/sq-m) Left atrium: Mild enlargement Right atrium: Normal size Mitral valve: Mild regurgitation Aortic valve: Tricuspid no regurgitation Tricuspid valve: Mild regurgitation Pulmonic valve: No regurgitation Aorta: Normal proximal ascending aorta Pericardium: Trivial effusion IMPRESSION: 1. Meets criteria for acute myocarditis with regional elevation in native T1/T2/ECV 2.  No late gadolinium enhancement to suggest myocardial scar 3. Normal LV size, no hypertrophy, and normal systolic function (EF 53%) 4.  Normal RV size and systolic function (EF 52%) Electronically Signed   By: Lonni Nanas M.D.   On: 04/06/2024 22:33   ECHOCARDIOGRAM COMPLETE Result Date: 04/06/2024    ECHOCARDIOGRAM REPORT   Patient Name:   Samantha Robinson Date of Exam: 04/06/2024 Medical Rec #:  [id number]         Height:       61.0 in Accession #:    [id number]        Weight:       215.4 lb Date of Birth:  Sep 14, 1975         BSA:          1.949 m Patient Age:    48 years          BP:           146/71 mmHg Patient Gender: F                 HR:           71 bpm. Exam Location:  Inpatient Procedure: 2D Echo (Both Spectral and Color Flow Doppler were utilized during            procedure). Indications:    CP  History:        Patient has no prior history of Echocardiogram examinations.                 Signs/Symptoms:Chest Pain.  Sonographer:    Norleen Amour Referring Phys: [id number] BRADLY POUR STEPHENS IMPRESSIONS  1. Left ventricular ejection fraction,  by estimation, is 50 to 55%. The left ventricle has low normal function. The left ventricle has no regional wall motion abnormalities. Left ventricular diastolic parameters are consistent with Grade I diastolic dysfunction (impaired relaxation).  2. Right ventricular systolic function is normal. The right ventricular size is normal. There is normal pulmonary artery systolic pressure.   3. The mitral valve is normal in structure. Mild mitral valve regurgitation. No evidence of mitral stenosis.  4. Tricuspid valve regurgitation is mild to moderate.  5. The aortic valve is normal in structure. Aortic valve regurgitation is not visualized. No aortic stenosis is present.  6. The inferior vena cava is normal in size with greater than 50% respiratory variability, suggesting right atrial pressure of 3 mmHg. FINDINGS  Left Ventricle: Left ventricular ejection fraction, by estimation, is 50 to 55%. The left ventricle has low normal function. The left ventricle has no regional wall motion abnormalities. The left ventricular internal cavity size was normal in size. There is no left ventricular hypertrophy. Left ventricular diastolic parameters are consistent with Grade I diastolic dysfunction (impaired relaxation). Right Ventricle: The right ventricular size is normal. No increase in right ventricular wall thickness. Right ventricular systolic function is normal. There is normal pulmonary artery systolic pressure. The tricuspid regurgitant velocity is 2.53 m/s, and  with an assumed right atrial pressure of 3 mmHg, the estimated right ventricular systolic pressure is 28.6 mmHg. Left Atrium: Left atrial size was normal in size. Right Atrium: Right atrial size was normal in size. Pericardium: There is no evidence of pericardial effusion. Mitral Valve: The mitral valve is normal in structure. Mild mitral valve regurgitation. No evidence of mitral valve stenosis. Tricuspid Valve: The tricuspid valve is normal in structure. Tricuspid valve regurgitation is mild to moderate. No evidence of tricuspid stenosis. Aortic Valve: The aortic valve is normal in structure. Aortic valve regurgitation is not visualized. No aortic stenosis is present. Pulmonic Valve: The pulmonic valve was normal in structure. Pulmonic valve regurgitation is not visualized. No evidence of pulmonic stenosis. Aorta: The aortic root is normal in  size and structure. Venous: The inferior vena cava is normal in size with greater than 50% respiratory variability, suggesting right atrial pressure of 3 mmHg. IAS/Shunts: No atrial level shunt detected by color flow Doppler.  LEFT VENTRICLE PLAX 2D LVIDd:         5.10 cm     Diastology LVIDs:         3.00 cm     LV e' medial:    7.18 cm/s LV PW:         0.80 cm     LV E/e' medial:  10.9 LV IVS:        0.70 cm     LV e' lateral:   12.40 cm/s LVOT diam:     1.90 cm     LV E/e' lateral: 6.3 LV SV:         51 LV SV Index:   26 LVOT Area:     2.84 cm  LV Volumes (MOD) LV vol d, MOD A2C: 58.4 ml LV vol d, MOD A4C: 70.0 ml LV vol s, MOD A2C: 30.6 ml LV vol s, MOD A4C: 34.6 ml LV SV MOD A2C:     27.8 ml LV SV MOD A4C:     70.0 ml LV SV MOD BP:      35.6 ml RIGHT VENTRICLE             IVC RV Basal diam:  3.50 cm     IVC diam: 1.20 cm RV S prime:     10.00 cm/s TAPSE (M-mode): 2.1 cm      PULMONARY VEINS                             Diastolic Velocity: 27.80 cm/s                             S/D Velocity:       2.00                             Systolic Velocity:  55.00 cm/s LEFT ATRIUM             Index        RIGHT ATRIUM           Index LA diam:        3.40 cm 1.74 cm/m   RA Area:     10.70 cm LA Vol (A2C):   38.0 ml 19.50 ml/m  RA Volume:   24.30 ml  12.47 ml/m LA Vol (A4C):   41.6 ml 21.34 ml/m LA Biplane Vol: 42.9 ml 22.01 ml/m  AORTIC VALVE             PULMONIC VALVE LVOT Vmax:   78.20 cm/s  PV Vmax:       0.91 m/s LVOT Vmean:  61.500 cm/s PV Peak grad:  3.3 mmHg LVOT VTI:    0.181 m  AORTA Ao Root diam: 2.50 cm Ao Asc diam:  2.60 cm MITRAL VALVE                TRICUSPID VALVE MV Area (PHT): 4.33 cm     TR Peak grad:   25.6 mmHg MV Decel Time: 175 msec     TR Vmax:        253.00 cm/s MV E velocity: 78.10 cm/s MV A velocity: 104.00 cm/s  SHUNTS MV E/A ratio:  0.75         Systemic VTI:  0.18 m                             Systemic Diam: 1.90 cm Morene Brownie Electronically signed by Morene Brownie Signature  Date/Time: 04/06/2024/1:54:50 PM    Final    CARDIAC CATHETERIZATION Result Date: 04/06/2024   Prox RCA lesion is 15% stenosed.   Mid RCA to Dist RCA lesion is 30% stenosed.   There is mild to moderate left ventricular systolic dysfunction.   LV end diastolic pressure is normal.   There is no aortic valve stenosis.   Anticipated discharge date to be determined.   No indication for antiplatelet therapy at this time . Conclusions: Mild, non-obstructive coronary artery disease.  No culprit lesion identified for patient's NSTEMI. Mildly-moderately reduced left ventricular systolic function (LVEF ~45%).  Query myocarditis given chest pain and elevated troponin with nonobstructive CAD. Normal left ventricular filling pressure (LVEDP 7 mmHg) Recommendations: Medical therapy and risk factor modification to prevent progression of mild coronary artery disease. Follow-up echocardiogram and consider cardiac MRI to evaluate further for acute myocarditis. Lonni Hanson, MD [facility]  DG Chest 2 View Result Date: 04/05/2024 CLINICAL DATA:  Chest and left arm pain. History of anemia and hypertension. EXAM: CHEST - 2 VIEW COMPARISON:  None Available.  FINDINGS: The heart size and mediastinal contours are within normal limits. Atelectasis or scarring is noted at the lung bases. No effusion or pneumothorax is seen. No acute osseous abnormality. IMPRESSION: No active cardiopulmonary disease. Electronically Signed   By: Leita Birmingham M.D.   On: 04/05/2024 15:22     Discharge Exam: Vitals:   04/07/24 0740 04/07/24 1150  BP: 133/83 (!) 134/98  Pulse: 75 70  Resp: 18 18  Temp: 98 F (36.7 C) 98 F (36.7 C)  SpO2: 100% 99%   Vitals:   04/06/24 2322 04/07/24 0521 04/07/24 0740 04/07/24 1150  BP: 115/72 (!) 143/96 133/83 (!) 134/98  Pulse:  75 75 70  Resp:  20 18 18   Temp: 98.3 F (36.8 C) 98.1 F (36.7 C) 98 F (36.7 C) 98 F (36.7 C)  TempSrc: Oral Oral Oral Oral  SpO2: 100% 99% 100% 99%  Weight:       Height:        General: Pt is alert, awake, not in acute distress Cardiovascular: RRR, S1/S2 +, no rubs, no gallops Respiratory: CTA bilaterally, no wheezing, no rhonchi Abdominal: Soft, NT, ND, bowel sounds + Extremities: no edema, no cyanosis    The results of significant diagnostics from this hospitalization (including imaging, microbiology, ancillary and laboratory) are listed below for reference.     Microbiology: No results found for this or any previous visit (from the past 240 hours).   Labs: BNP (last 3 results) No results for input(s): BNP in the last 8760 hours. Basic Metabolic Panel: Recent Labs  Lab 04/05/24 1421 04/05/24 2012 04/06/24 0233 04/07/24 0516  NA 136  --  135 137  K 3.2*  --  3.7 3.8  CL 102  --  101 103  CO2 19*  --  29 22  GLUCOSE 97  --  95 84  BUN 7  --  7 10  CREATININE 0.67  --  0.62 0.76  CALCIUM  9.0  --  8.9 9.1  MG  --  2.1 2.1  --    Liver Function Tests: Recent Labs  Lab 04/06/24 0233  AST 26  ALT 17  ALKPHOS 35*  BILITOT 0.9  PROT 6.9  ALBUMIN 3.3*   No results for input(s): LIPASE, AMYLASE in the last 168 hours. No results for input(s): AMMONIA in the last 168 hours. CBC: Recent Labs  Lab 04/05/24 1421 04/06/24 0612 04/07/24 0516  WBC 4.2 3.8* 5.0  NEUTROABS  --  1.9  --   HGB 11.9* 12.3 13.2  HCT 38.6 39.5 43.0  MCV 85.6 85.5 86.5  PLT 315 289 383   Cardiac Enzymes: No results for input(s): CKTOTAL, CKMB, CKMBINDEX, TROPONINI in the last 168 hours. BNP: Invalid input(s): POCBNP CBG: No results for input(s): GLUCAP in the last 168 hours. D-Dimer No results for input(s): DDIMER in the last 72 hours. Hgb A1c Recent Labs    04/05/24 2012  HGBA1C 4.0*   Lipid Profile Recent Labs    04/05/24 2012  CHOL 170  HDL 76  LDLCALC 79  TRIG 74  CHOLHDL 2.2   Thyroid  function studies Recent Labs    04/07/24 0516  TSH 0.110*   Anemia work up No results for input(s):  VITAMINB12, FOLATE, FERRITIN, TIBC, IRON, RETICCTPCT in the last 72 hours. Urinalysis    Component Value Date/Time   COLORURINE YELLOW 08/12/2017 1705   APPEARANCEUR CLEAR 08/12/2017 1705   LABSPEC <=1.005 06/14/2021 1950   PHURINE 6.0 06/14/2021 1950   GLUCOSEU NEGATIVE  06/14/2021 1950   HGBUR LARGE (A) 06/14/2021 1950   BILIRUBINUR NEGATIVE 06/14/2021 1950   KETONESUR NEGATIVE 06/14/2021 1950   PROTEINUR 30 (A) 06/14/2021 1950   UROBILINOGEN 0.2 06/14/2021 1950   NITRITE NEGATIVE 06/14/2021 1950   LEUKOCYTESUR SMALL (A) 06/14/2021 1950   Sepsis Labs Recent Labs  Lab 04/05/24 1421 04/06/24 0612 04/07/24 0516  WBC 4.2 3.8* 5.0   Microbiology No results found for this or any previous visit (from the past 240 hours).  FURTHER DISCHARGE INSTRUCTIONS:   Get Medicines reviewed and adjusted: Please take all your medications with you for your next visit with your Primary MD   Laboratory/radiological data: Please request your Primary MD to go over all hospital tests and procedure/radiological results at the follow up, please ask your Primary MD to get all Hospital records sent to his/her office.   In some cases, they will be blood work, cultures and biopsy results pending at the time of your discharge. Please request that your primary care M.D. goes through all the records of your hospital data and follows up on these results.   Also Note the following: If you experience worsening of your admission symptoms, develop shortness of breath, life threatening emergency, suicidal or homicidal thoughts you must seek medical attention immediately by calling 911 or calling your MD immediately  if symptoms less severe.   You must read complete instructions/literature along with all the possible adverse reactions/side effects for all the Medicines you take and that have been prescribed to you. Take any new Medicines after you have completely understood and accpet all the possible  adverse reactions/side effects.    patient was instructed, not to drive, operate heavy machinery, perform activities at heights, swimming or participation in water  activities or provide baby-sitting services while on Pain, Sleep and Anxiety Medications; until their outpatient Physician has advised to do so again. Also recommended to not to take more than prescribed Pain, Sleep and Anxiety Medications.  It is not advisable to combine anxiety, sleep and pain medications without talking with your primary care provider.     Wear Seat belts while driving.   Please note: You were cared for by a hospitalist during your hospital stay. Once you are discharged, your primary care physician will handle any further medical issues. Please note that NO REFILLS for any discharge medications will be authorized once you are discharged, as it is imperative that you return to your primary care physician (or establish a relationship with a primary care physician if you do not have one) for your post hospital discharge needs so that they can reassess your need for medications and monitor your lab values  Time coordinating discharge: Over 30 minutes  SIGNED:   Fredia Skeeter, MD  [facility] 04/07/2024, 3:48 PM *Please note that this is a verbal dictation therefore any spelling or grammatical errors are due to the Dragon Medical One system interpretation. If 7PM-7AM, please contact night-coverage [email or web address]

## 2024-04-08 ENCOUNTER — Ambulatory Visit: Payer: Self-pay | Admitting: Internal Medicine

## 2024-04-08 LAB — T3: T3, Total: 110 ng/dL (ref 71–180)

## 2024-04-08 LAB — LIPOPROTEIN A (LPA): Lipoprotein (a): 204.5 nmol/L — ABNORMAL HIGH (ref <75.0)

## 2024-04-11 ENCOUNTER — Encounter: Payer: Self-pay | Admitting: Cardiology

## 2024-04-16 ENCOUNTER — Other Ambulatory Visit (HOSPITAL_COMMUNITY): Payer: Self-pay

## 2024-04-17 ENCOUNTER — Other Ambulatory Visit: Payer: Self-pay

## 2024-04-17 ENCOUNTER — Other Ambulatory Visit (HOSPITAL_COMMUNITY): Payer: Self-pay

## 2024-04-17 MED ORDER — CITALOPRAM HYDROBROMIDE 20 MG PO TABS
20.0000 mg | ORAL_TABLET | Freq: Every day | ORAL | 5 refills | Status: AC
  Filled 2024-04-17 – 2024-04-22 (×2): qty 90, 90d supply, fill #0

## 2024-04-17 MED ORDER — LEVOCETIRIZINE DIHYDROCHLORIDE 5 MG PO TABS
5.0000 mg | ORAL_TABLET | Freq: Every evening | ORAL | 5 refills | Status: AC
  Filled 2024-04-17 – 2024-04-22 (×2): qty 90, 90d supply, fill #0

## 2024-04-21 DIAGNOSIS — E039 Hypothyroidism, unspecified: Secondary | ICD-10-CM | POA: Diagnosis not present

## 2024-04-21 DIAGNOSIS — I514 Myocarditis, unspecified: Secondary | ICD-10-CM | POA: Diagnosis not present

## 2024-04-21 DIAGNOSIS — I10 Essential (primary) hypertension: Secondary | ICD-10-CM | POA: Diagnosis not present

## 2024-04-22 ENCOUNTER — Telehealth: Payer: Self-pay | Admitting: Cardiology

## 2024-04-22 ENCOUNTER — Other Ambulatory Visit (HOSPITAL_COMMUNITY): Payer: Self-pay

## 2024-04-22 DIAGNOSIS — Z0279 Encounter for issue of other medical certificate: Secondary | ICD-10-CM

## 2024-04-22 NOTE — Telephone Encounter (Signed)
 Received an FMLA form from [employer].  Patient signed Release of information and paid the $29 fee.  Form is in Dr. Genice box.

## 2024-04-23 ENCOUNTER — Encounter: Payer: Self-pay | Admitting: Pharmacist

## 2024-04-23 ENCOUNTER — Other Ambulatory Visit: Payer: Self-pay

## 2024-04-24 ENCOUNTER — Other Ambulatory Visit (HOSPITAL_COMMUNITY): Payer: Self-pay

## 2024-04-25 NOTE — Progress Notes (Unsigned)
 Cardiology Office Note:    Date:  04/27/2024   ID:  [name], DOB [birth year], MRN [id number]  PCP:  Ransom Other, MD   [facility] Providers Cardiologist:  Alm Clay, MD     Referring MD: Ransom Other, MD   Chief Complaint  Patient presents with   Follow-up    Myocarditis    History of Present Illness:    Samantha Robinson is a 48 y.o. 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 She has a significant history of premature heart disease, father had first MI at age 54.   LHC 04/06/24 demonstrated mild nonosbtructive CAD, no culprit lesion for NSTEMI. Mild to moderate LV reduction noted at 45%. Follow up echo showed LVEF 50-55%, grade 1 DD, normal RV, mild MR, mild to moderate TR. She underwent cMRI 04/06/24 that was consistent with acute myocarditis, no LGE suggestive of scar.   Given GI issues, negative inflammatory markers, and myocarditis, colchicine was not ordered.   Troponin peaked at 1215 LPA significantly elevated at 204. TSH 0.11  Recently saw PCP who restarted 12.5 mg hydrochlorothiazide , started Wed morning.   She presents for routine follow up. She continues to have 2/10 constant chest pain, but a bit worse at the end of a work day. Rest relieved the chest pain. She is active working at [employer] and walks all day with her job - suspect she will continue to improve after her event.   Recommend 27 year old son be screen for hyperlipidemia.    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    Past Surgical History:  Procedure Laterality Date   CYSTOSCOPY N/A 01/06/2020   Procedure: CYSTOSCOPY;  Surgeon: Taam-Akelman, Rosalea K, MD;  Location: [facility];  Service: Gynecology;  Laterality: N/A;   LEFT HEART CATH AND CORONARY ANGIOGRAPHY N/A 04/06/2024   Procedure: LEFT HEART CATH AND CORONARY ANGIOGRAPHY;  Surgeon: Mady Bruckner, MD;  Location: [facility];  Service: Cardiovascular;  Laterality: N/A;   ROBOTIC ASSISTED LAPAROSCOPIC HYSTERECTOMY AND SALPINGECTOMY Bilateral 01/06/2020   Procedure: XI ROBOTIC ASSISTED LAPAROSCOPIC HYSTERECTOMY AND SALPINGECTOMY;  Surgeon: Bonnielee Rayleen POUR, MD;  Location: [facility];  Service: Gynecology;  Laterality: Bilateral;   TUBAL LIGATION      Current Medications: Active Medications[1]   Allergies:   Penicillins   Social History   Socioeconomic History   Marital status: Single    Spouse name: Not on file   Number of children: Not on file   Years of education: Not on file   Highest education level: Not on file  Occupational History   Not on file  Tobacco Use   Smoking status: Never   Smokeless tobacco: Never  Vaping Use   Vaping status: Never Used  Substance and Sexual Activity   Alcohol use: Yes    Comment: occasional   Drug use: Never   Sexual activity: Not on file  Other Topics Concern   Not on file  Social History Narrative   Not on file   Social Drivers of Health   Tobacco Use: Low Risk (04/27/2024)   Patient History  Smoking Tobacco Use: Never    Smokeless Tobacco Use: Never    Passive Exposure: Not on file  Financial Resource Strain: Not on file  Food Insecurity: No Food Insecurity (04/06/2024)   Epic    Worried About Programme Researcher, Broadcasting/film/video in the Last Year: Never true    Ran Out of Food in the Last Year: Never true  Transportation Needs: No Transportation Needs (04/06/2024)   Epic    Lack of Transportation (Medical): No    Lack of Transportation (Non-Medical): No  Physical Activity: Not on file  Stress: Not on file  Social Connections: Unknown (12/06/2021)   Received from [facility]   Social Network    Social Network: Not on  file  Depression [phone]): Not on file  Alcohol Screen: Not on file  Housing: Low Risk (04/06/2024)   Epic    Unable to Pay for Housing in the Last Year: No    Number of Times Moved in the Last Year: 0    Homeless in the Last Year: No  Utilities: Not At Risk (04/06/2024)   Epic    Threatened with loss of utilities: No  Health Literacy: Not on file     Family History: The patient's family history is not on file.  ROS:   Please see the history of present illness.     All other systems reviewed and are negative.  EKGs/Labs/Other Studies Reviewed:    The following studies were reviewed today:  EKG Interpretation Date/Time:  Monday April 27 2024 14:47:39 EST Ventricular Rate:  62 PR Interval:  134 QRS Duration:  84 QT Interval:  410 QTC Calculation: 416 R Axis:   51  Text Interpretation: Normal sinus rhythm Possible Left atrial enlargement T wave abnormality, consider anterior ischemia When compared with ECG of 06-Apr-2024 00:08, PREVIOUS ECG IS PRESENT Confirmed by Madie Slough ([id number]) on 04/27/2024 2:54:13 PM    Recent Labs: 04/06/2024: ALT 17; Magnesium 2.1 04/07/2024: BUN 10; Creatinine, Ser 0.76; Hemoglobin 13.2; Platelets 383; Potassium 3.8; Sodium 137; TSH 0.110  Recent Lipid Panel    Component Value Date/Time   CHOL 170 04/05/2024 2012   TRIG 74 04/05/2024 2012   HDL 76 04/05/2024 2012   CHOLHDL 2.2 04/05/2024 2012   VLDL 15 04/05/2024 2012   LDLCALC 79 04/05/2024 2012     Risk Assessment/Calculations:                Physical Exam:    VS:  BP 130/82 (BP Location: Left Arm, Patient Position: Sitting, Cuff Size: Large)   Pulse 62   Ht 5' 1 (1.549 m)   Wt 219 lb (99.3 kg)   LMP 09/23/2019   SpO2 97%   BMI 41.38 kg/m     Wt Readings from Last 3 Encounters:  04/27/24 219 lb (99.3 kg)  04/06/24 215 lb 6.2 oz (97.7 kg)  01/06/20 222 lb 3.2 oz (100.8 kg)     GEN:  Well nourished, well developed in no acute distress HEENT: Normal NECK: No  JVD; No carotid bruits LYMPHATICS: No lymphadenopathy CARDIAC: RRR, no murmurs, rubs, gallops RESPIRATORY:  Clear to auscultation without rales, wheezing or rhonchi  ABDOMEN: Soft, non-tender, non-distended MUSCULOSKELETAL:  No edema; No deformity  SKIN: Warm and dry NEUROLOGIC:  Alert and oriented x 3 PSYCHIATRIC:  Normal affect   ASSESSMENT:    1. Chest pain of uncertain etiology   2. Myocarditis, unspecified chronicity, unspecified myocarditis type (HCC)   3. Primary hypertension   4. Essential hypertension  PLAN:    In order of problems listed above:  Acute myocarditis - inflammatory markers were not elevated - chest pain continues to improve   Hyperlipidemia with LDL goal < 55 Elevated LPA - LPA > 200 04/05/2024: Cholesterol 170; HDL 76; LDL Cholesterol 79; Triglycerides 74; VLDL 15 - continue 40 mg lipitor - will refer to pharmD for PCSK9i, also recommended her son be screens (father had first MI at age 20)   Hypertension - currently on lopressor  25 mg BID - recently started 12.5 mg hydrochlorothiazide  - change lopressor  to 25 mg toprol    Will recheck echo in 2 months, follow up in 3 months.     Cardiac Rehabilitation Eligibility Assessment  The patient is ready to start cardiac rehabilitation from a cardiac standpoint.          Medication Adjustments/Labs and Tests Ordered: Current medicines are reviewed at length with the patient today.  Concerns regarding medicines are outlined above.  Orders Placed This Encounter  Procedures   EKG 12-Lead   No orders of the defined types were placed in this encounter.   Patient Instructions  Medication Instructions:  STOP Metoprolol  Tartrate 25 mg  START Metoprolol  Succinate 25 mg nightly  *If you need a refill on your cardiac medications before your next appointment, please call your pharmacy*  Lab Work: NONE ORDERED  Testing/Procedures: Your physician has requested that you have an echocardiogram in  2 months. Echocardiography is a painless test that uses sound waves to create images of your heart. It provides your doctor with information about the size and shape of your heart and how well your hearts chambers and valves are working. This procedure takes approximately one hour. There are no restrictions for this procedure. Please do NOT wear cologne, perfume, aftershave, or lotions (deodorant is allowed). Please arrive 15 minutes prior to your appointment time.  Please note: We ask at that you not bring children with you during ultrasound (echo/ vascular) testing. Due to room size and safety concerns, children are not allowed in the ultrasound rooms during exams. Our front office staff cannot provide observation of children in our lobby area while testing is being conducted. An adult accompanying a patient to their appointment will only be allowed in the ultrasound room at the discretion of the ultrasound technician under special circumstances. We apologize for any inconvenience.   Follow-Up: At [facility], you and your health needs are our priority.  As part of our continuing mission to provide you with exceptional heart care, our providers are all part of one team.  This team includes your primary Cardiologist (physician) and Advanced Practice Providers or APPs (Physician Assistants and Nurse Practitioners) who all work together to provide you with the care you need, when you need it.  Your next appointment:   3 months  Dr. Anner Gower D appointment needed  We recommend signing up for the patient portal called MyChart.  Sign up information is provided on this After Visit Summary.  MyChart is used to connect with patients for Virtual Visits (Telemedicine).  Patients are able to view lab/test results, encounter notes, upcoming appointments, etc.  Non-urgent messages can be sent to your provider as well.   To learn more about what you can do with MyChart, go to  [email or web address].               Signed, Jon Nat Hails, [place]  04/27/2024 3:39 PM    [facility]     [1]  Current  Meds  Medication Sig   albuterol  (VENTOLIN  HFA) 108 (90 Base) MCG/ACT inhaler Inhale 2 puffs into the lungs every 6 (six) hours as needed.   aspirin  EC 81 MG tablet Take 1 tablet (81 mg total) by mouth daily. Swallow whole.   atorvastatin  (LIPITOR) 40 MG tablet Take 1 tablet (40 mg total) by mouth daily.   citalopram  (CELEXA ) 20 MG tablet Take 1 tablet (20 mg total) by mouth daily.   cyclobenzaprine  (FLEXERIL ) 10 MG tablet Take 1 tablet (10 mg total) by mouth at bedtime as needed.   ferrous sulfate  325 (65 FE) MG tablet Take 325 mg by mouth daily with breakfast.   fluticasone  (FLONASE ) 50 MCG/ACT nasal spray Place 2 sprays into both nostrils daily.   hydrochlorothiazide  (HYDRODIURIL ) 25 MG tablet Take 25 mg by mouth daily. Takes 0.5 tablets daily   levocetirizine (XYZAL ) 5 MG tablet Take 1 tablet (5 mg total) by mouth every evening.   levothyroxine  (SYNTHROID ) 75 MCG tablet Take 1 tablet (75 mcg total) by mouth daily.   omeprazole  (PRILOSEC) 40 MG capsule Take 1 capsule (40 mg total) by mouth daily 1/2 to 1 hour before morning meal   Vitamin D , Ergocalciferol , 50000 units CAPS Take 1 capsule by mouth once a week. (Patient taking differently: Take 1 capsule by mouth every Wednesday.)   [DISCONTINUED] metoprolol  tartrate (LOPRESSOR ) 25 MG tablet Take 1 tablet (25 mg total) by mouth 2 (two) times daily.

## 2024-04-27 ENCOUNTER — Other Ambulatory Visit (HOSPITAL_COMMUNITY): Payer: Self-pay

## 2024-04-27 ENCOUNTER — Ambulatory Visit: Attending: Physician Assistant | Admitting: Physician Assistant

## 2024-04-27 ENCOUNTER — Encounter: Payer: Self-pay | Admitting: Physician Assistant

## 2024-04-27 VITALS — BP 130/82 | HR 62 | Ht 61.0 in | Wt 219.0 lb

## 2024-04-27 DIAGNOSIS — R079 Chest pain, unspecified: Secondary | ICD-10-CM

## 2024-04-27 DIAGNOSIS — E7841 Elevated Lipoprotein(a): Secondary | ICD-10-CM | POA: Diagnosis not present

## 2024-04-27 DIAGNOSIS — I10 Essential (primary) hypertension: Secondary | ICD-10-CM

## 2024-04-27 DIAGNOSIS — K219 Gastro-esophageal reflux disease without esophagitis: Secondary | ICD-10-CM | POA: Diagnosis not present

## 2024-04-27 DIAGNOSIS — I514 Myocarditis, unspecified: Secondary | ICD-10-CM | POA: Diagnosis not present

## 2024-04-27 MED ORDER — METOPROLOL SUCCINATE ER 25 MG PO TB24
25.0000 mg | ORAL_TABLET | Freq: Every evening | ORAL | 3 refills | Status: AC
  Filled 2024-04-27: qty 90, 90d supply, fill #0

## 2024-04-27 NOTE — Patient Instructions (Signed)
 Medication Instructions:  STOP Metoprolol  Tartrate 25 mg  START Metoprolol  Succinate 25 mg nightly  *If you need a refill on your cardiac medications before your next appointment, please call your pharmacy*  Lab Work: NONE ORDERED  Testing/Procedures: Your physician has requested that you have an echocardiogram in 2 months. Echocardiography is a painless test that uses sound waves to create images of your heart. It provides your doctor with information about the size and shape of your heart and how well your hearts chambers and valves are working. This procedure takes approximately one hour. There are no restrictions for this procedure. Please do NOT wear cologne, perfume, aftershave, or lotions (deodorant is allowed). Please arrive 15 minutes prior to your appointment time.  Please note: We ask at that you not bring children with you during ultrasound (echo/ vascular) testing. Due to room size and safety concerns, children are not allowed in the ultrasound rooms during exams. Our front office staff cannot provide observation of children in our lobby area while testing is being conducted. An adult accompanying a patient to their appointment will only be allowed in the ultrasound room at the discretion of the ultrasound technician under special circumstances. We apologize for any inconvenience.   Follow-Up: At [facility], you and your health needs are our priority.  As part of our continuing mission to provide you with exceptional heart care, our providers are all part of one team.  This team includes your primary Cardiologist (physician) and Advanced Practice Providers or APPs (Physician Assistants and Nurse Practitioners) who all work together to provide you with the care you need, when you need it.  Your next appointment:   3 months  Dr. Anner Gower D appointment needed  We recommend signing up for the patient portal called MyChart.  Sign up information is provided on this  After Visit Summary.  MyChart is used to connect with patients for Virtual Visits (Telemedicine).  Patients are able to view lab/test results, encounter notes, upcoming appointments, etc.  Non-urgent messages can be sent to your provider as well.   To learn more about what you can do with MyChart, go to [email or web address].

## 2024-04-30 DIAGNOSIS — I8393 Asymptomatic varicose veins of bilateral lower extremities: Secondary | ICD-10-CM | POA: Diagnosis not present

## 2024-04-30 DIAGNOSIS — I809 Phlebitis and thrombophlebitis of unspecified site: Secondary | ICD-10-CM | POA: Diagnosis not present

## 2024-05-01 ENCOUNTER — Other Ambulatory Visit (HOSPITAL_COMMUNITY): Payer: Self-pay

## 2024-05-05 ENCOUNTER — Other Ambulatory Visit: Payer: Self-pay

## 2024-05-05 ENCOUNTER — Other Ambulatory Visit (HOSPITAL_COMMUNITY): Payer: Self-pay

## 2024-05-06 ENCOUNTER — Other Ambulatory Visit (HOSPITAL_COMMUNITY): Payer: Self-pay

## 2024-05-06 ENCOUNTER — Other Ambulatory Visit: Payer: Self-pay

## 2024-05-11 ENCOUNTER — Encounter (HOSPITAL_COMMUNITY): Payer: Self-pay

## 2024-05-11 ENCOUNTER — Other Ambulatory Visit (HOSPITAL_COMMUNITY): Payer: Self-pay

## 2024-05-11 ENCOUNTER — Other Ambulatory Visit: Payer: Self-pay

## 2024-05-15 NOTE — Telephone Encounter (Signed)
 FMLA form faxed to [employer] and scanned into chart. Billing notified.

## 2024-05-19 ENCOUNTER — Encounter: Payer: Self-pay | Admitting: Cardiology

## 2024-05-19 ENCOUNTER — Other Ambulatory Visit: Payer: Self-pay

## 2024-05-19 ENCOUNTER — Other Ambulatory Visit (HOSPITAL_COMMUNITY): Payer: Self-pay

## 2024-05-19 MED ORDER — ATORVASTATIN CALCIUM 40 MG PO TABS
40.0000 mg | ORAL_TABLET | Freq: Every day | ORAL | 0 refills | Status: DC
  Filled 2024-05-19: qty 30, 30d supply, fill #0

## 2024-05-21 NOTE — Telephone Encounter (Signed)
 I am hoping that this will be something you recover from and this should not be a recurrent issue.  If it does become an issue, we can address it then.

## 2024-05-21 NOTE — Telephone Encounter (Signed)
 Should be fine to travel by then.

## 2024-05-25 ENCOUNTER — Other Ambulatory Visit (HOSPITAL_COMMUNITY): Payer: Self-pay

## 2024-05-25 MED ORDER — HYDROCHLOROTHIAZIDE 25 MG PO TABS
12.5000 mg | ORAL_TABLET | Freq: Every day | ORAL | 1 refills | Status: AC
  Filled 2024-05-25 (×2): qty 45, 90d supply, fill #0

## 2024-06-11 ENCOUNTER — Encounter: Payer: Self-pay | Admitting: Cardiology

## 2024-06-12 ENCOUNTER — Other Ambulatory Visit (HOSPITAL_COMMUNITY): Payer: Self-pay

## 2024-06-12 ENCOUNTER — Encounter (HOSPITAL_COMMUNITY): Payer: Self-pay

## 2024-06-13 ENCOUNTER — Other Ambulatory Visit (HOSPITAL_COMMUNITY): Payer: Self-pay

## 2024-06-13 MED ORDER — ATORVASTATIN CALCIUM 40 MG PO TABS
40.0000 mg | ORAL_TABLET | Freq: Every day | ORAL | 3 refills | Status: AC
  Filled 2024-06-13: qty 90, 90d supply, fill #0

## 2024-06-15 ENCOUNTER — Ambulatory Visit: Admitting: Pharmacist Clinician (PhC)/ Clinical Pharmacy Specialist

## 2024-06-15 ENCOUNTER — Encounter: Payer: Self-pay | Admitting: Pharmacist Clinician (PhC)/ Clinical Pharmacy Specialist

## 2024-06-15 ENCOUNTER — Other Ambulatory Visit: Payer: Self-pay

## 2024-06-15 ENCOUNTER — Other Ambulatory Visit (HOSPITAL_COMMUNITY): Payer: Self-pay

## 2024-06-15 ENCOUNTER — Telehealth: Payer: Self-pay | Admitting: Pharmacist Clinician (PhC)/ Clinical Pharmacy Specialist

## 2024-06-15 DIAGNOSIS — E785 Hyperlipidemia, unspecified: Secondary | ICD-10-CM | POA: Diagnosis not present

## 2024-06-15 MED ORDER — ASPIRIN 81 MG PO TBEC
81.0000 mg | DELAYED_RELEASE_TABLET | Freq: Every day | ORAL | 1 refills | Status: AC
  Filled 2024-06-15 – 2024-06-16 (×2): qty 90, 90d supply, fill #0

## 2024-06-15 NOTE — Telephone Encounter (Signed)
 Please do PA for Repatha

## 2024-06-16 ENCOUNTER — Encounter: Payer: Self-pay | Admitting: Cardiology

## 2024-06-16 ENCOUNTER — Other Ambulatory Visit: Payer: Self-pay

## 2024-06-16 ENCOUNTER — Other Ambulatory Visit (HOSPITAL_COMMUNITY): Payer: Self-pay

## 2024-06-16 ENCOUNTER — Telehealth: Payer: Self-pay | Admitting: Pharmacy Technician

## 2024-06-16 NOTE — Telephone Encounter (Signed)
" ° °  Ran test claim for repatha . For a 28 day supply and the co-pay is 34.99 . PA is not needed at this time. This test claim was processed through [facility]- copay amounts may vary at other pharmacies due to pharmacy/plan contracts, or as the patient moves through the different stages of their insurance plan.    "

## 2024-06-18 ENCOUNTER — Telehealth (HOSPITAL_COMMUNITY): Payer: Self-pay

## 2024-06-18 ENCOUNTER — Other Ambulatory Visit (HOSPITAL_COMMUNITY): Payer: Self-pay

## 2024-06-18 ENCOUNTER — Encounter (HOSPITAL_COMMUNITY): Payer: Self-pay

## 2024-06-18 ENCOUNTER — Other Ambulatory Visit: Payer: Self-pay

## 2024-06-18 NOTE — Telephone Encounter (Signed)
 Patient called back regarding cardiac rehab, is unsure of wanting to do the program due to cost. Will f/u in 2 weeks to see if she wants to get scheduled.

## 2024-06-18 NOTE — Telephone Encounter (Signed)
 Attempted to schedule cardiac rehab- no answer, left message. Sent MyChart message.

## 2024-06-18 NOTE — Telephone Encounter (Signed)
 Pt insurance is active and benefits verified through [employer]. Co-pay $0, DED $300/$0 met, out of pocket $7,900/$30 met, co-insurance 20%. No pre-authorization required. 06/18/2024 @ 1:00pm, spoke with Rosaline, REF# [id number].  TCR/ICR? ICR Visit(date of service)limitation? No Can multiple codes be used on the same date of service/visit?(IF ITS A LIMIT) N/A  Is this a lifetime maximum or an annual maximum? Annual Has the member used any of these services to date? No Is there a time limit (weeks/months) on start of program and/or program completion? No

## 2024-06-19 ENCOUNTER — Other Ambulatory Visit (HOSPITAL_COMMUNITY): Payer: Self-pay

## 2024-06-19 ENCOUNTER — Other Ambulatory Visit: Payer: Self-pay

## 2024-06-19 ENCOUNTER — Encounter: Payer: Self-pay | Admitting: Pharmacist Clinician (PhC)/ Clinical Pharmacy Specialist

## 2024-06-19 DIAGNOSIS — E785 Hyperlipidemia, unspecified: Secondary | ICD-10-CM

## 2024-06-19 MED ORDER — REPATHA SURECLICK 140 MG/ML ~~LOC~~ SOAJ
140.0000 mg | SUBCUTANEOUS | 3 refills | Status: AC
  Filled 2024-06-19: qty 6, 84d supply, fill #0

## 2024-06-29 ENCOUNTER — Ambulatory Visit (HOSPITAL_COMMUNITY)

## 2024-06-30 ENCOUNTER — Ambulatory Visit (HOSPITAL_COMMUNITY)

## 2024-08-03 ENCOUNTER — Ambulatory Visit: Admitting: Cardiology
